# Patient Record
Sex: Male | Born: 1975
Health system: Southern US, Community
[De-identification: ages and names within clinical notes are randomized; demographics above are authoritative.]

## PROBLEM LIST (undated history)

## (undated) DIAGNOSIS — F99 Mental disorder, not otherwise specified: Secondary | ICD-10-CM

## (undated) DIAGNOSIS — R51 Headache: Secondary | ICD-10-CM

## (undated) DIAGNOSIS — W3400XA Accidental discharge from unspecified firearms or gun, initial encounter: Secondary | ICD-10-CM

## (undated) DIAGNOSIS — R569 Unspecified convulsions: Secondary | ICD-10-CM

## (undated) HISTORY — PX: FRACTURE SURGERY: SHX138

## (undated) HISTORY — PX: KNEE SURGERY: SHX244

---
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
## 2001-10-04 ENCOUNTER — Encounter: Payer: Self-pay | Admitting: *Deleted

## 2001-10-04 ENCOUNTER — Inpatient Hospital Stay (HOSPITAL_COMMUNITY): Admission: EM | Admit: 2001-10-04 | Discharge: 2001-10-05 | Payer: Self-pay

## 2001-10-14 ENCOUNTER — Inpatient Hospital Stay (HOSPITAL_COMMUNITY): Admission: RE | Admit: 2001-10-14 | Discharge: 2001-10-18 | Payer: Self-pay | Admitting: *Deleted

## 2002-07-09 ENCOUNTER — Emergency Department (HOSPITAL_COMMUNITY): Admission: EM | Admit: 2002-07-09 | Discharge: 2002-07-09 | Payer: Self-pay | Admitting: Emergency Medicine

## 2002-07-09 ENCOUNTER — Encounter: Payer: Self-pay | Admitting: Emergency Medicine

## 2002-08-20 ENCOUNTER — Emergency Department (HOSPITAL_COMMUNITY): Admission: EM | Admit: 2002-08-20 | Discharge: 2002-08-20 | Payer: Self-pay | Admitting: Emergency Medicine

## 2002-10-01 ENCOUNTER — Emergency Department (HOSPITAL_COMMUNITY): Admission: EM | Admit: 2002-10-01 | Discharge: 2002-10-01 | Payer: Self-pay | Admitting: Emergency Medicine

## 2002-10-09 ENCOUNTER — Emergency Department (HOSPITAL_COMMUNITY): Admission: EM | Admit: 2002-10-09 | Discharge: 2002-10-09 | Payer: Self-pay | Admitting: Emergency Medicine

## 2013-03-24 ENCOUNTER — Inpatient Hospital Stay (HOSPITAL_COMMUNITY)
Admission: EM | Admit: 2013-03-24 | Discharge: 2013-03-27 | DRG: 494 | Disposition: A | Discharge status: Home / Self Care | Payer: No Typology Code available for payment source | Attending: Orthopedic Surgery | Admitting: Orthopedic Surgery

## 2013-03-24 ENCOUNTER — Encounter (HOSPITAL_COMMUNITY)
Admission: EM | Disposition: A | Discharge status: Home / Self Care | Payer: Self-pay | Source: Home / Self Care | Attending: Orthopedic Surgery

## 2013-03-24 ENCOUNTER — Inpatient Hospital Stay (HOSPITAL_COMMUNITY): Payer: No Typology Code available for payment source

## 2013-03-24 ENCOUNTER — Emergency Department (HOSPITAL_COMMUNITY): Payer: No Typology Code available for payment source

## 2013-03-24 ENCOUNTER — Encounter (HOSPITAL_COMMUNITY): Payer: Self-pay | Admitting: Anesthesiology

## 2013-03-24 ENCOUNTER — Emergency Department (HOSPITAL_COMMUNITY): Payer: No Typology Code available for payment source | Admitting: Anesthesiology

## 2013-03-24 ENCOUNTER — Encounter (HOSPITAL_COMMUNITY): Payer: Self-pay

## 2013-03-24 DIAGNOSIS — T148XXA Other injury of unspecified body region, initial encounter: Secondary | ICD-10-CM | POA: Diagnosis present

## 2013-03-24 DIAGNOSIS — S71009A Unspecified open wound, unspecified hip, initial encounter: Secondary | ICD-10-CM | POA: Diagnosis present

## 2013-03-24 DIAGNOSIS — Z4789 Encounter for other orthopedic aftercare: Secondary | ICD-10-CM

## 2013-03-24 DIAGNOSIS — S71109A Unspecified open wound, unspecified thigh, initial encounter: Secondary | ICD-10-CM | POA: Diagnosis present

## 2013-03-24 DIAGNOSIS — Y929 Unspecified place or not applicable: Secondary | ICD-10-CM

## 2013-03-24 DIAGNOSIS — W3400XA Accidental discharge from unspecified firearms or gun, initial encounter: Secondary | ICD-10-CM

## 2013-03-24 DIAGNOSIS — S82899A Other fracture of unspecified lower leg, initial encounter for closed fracture: Principal | ICD-10-CM | POA: Diagnosis present

## 2013-03-24 HISTORY — PX: I&D EXTREMITY: SHX5045

## 2013-03-24 HISTORY — PX: EXTERNAL FIXATION LEG: SHX1549

## 2013-03-24 HISTORY — DX: Accidental discharge from unspecified firearms or gun, initial encounter: W34.00XA

## 2013-03-24 LAB — CBC WITH DIFFERENTIAL/PLATELET
Basophils Absolute: 0.1 10*3/uL (ref 0.0–0.1)
Basophils Relative: 1 % (ref 0–1)
Eosinophils Absolute: 0.2 10*3/uL (ref 0.0–0.7)
Eosinophils Relative: 1 % (ref 0–5)
HCT: 48.4 % (ref 39.0–52.0)
Lymphocytes Relative: 30 % (ref 12–46)
MCHC: 35.3 g/dL (ref 30.0–36.0)
MCV: 92.5 fL (ref 78.0–100.0)
Monocytes Absolute: 1 10*3/uL (ref 0.1–1.0)
Platelets: 351 10*3/uL (ref 150–400)
RDW: 13.2 % (ref 11.5–15.5)
WBC: 16.1 10*3/uL — ABNORMAL HIGH (ref 4.0–10.5)

## 2013-03-24 LAB — POCT I-STAT, CHEM 8
BUN: 5 mg/dL — ABNORMAL LOW (ref 6–23)
Calcium, Ion: 1.07 mmol/L — ABNORMAL LOW (ref 1.12–1.23)
Chloride: 108 mEq/L (ref 96–112)
Creatinine, Ser: 1.3 mg/dL (ref 0.50–1.35)
TCO2: 19 mmol/L (ref 0–100)

## 2013-03-24 LAB — TYPE AND SCREEN
ABO/RH(D): O POS
Antibody Screen: NEGATIVE

## 2013-03-24 LAB — BASIC METABOLIC PANEL
Calcium: 9.3 mg/dL (ref 8.4–10.5)
Creatinine, Ser: 1.11 mg/dL (ref 0.50–1.35)
GFR calc Af Amer: >90 mL/min (ref >90)
GFR calc non Af Amer: 83 mL/min — ABNORMAL LOW (ref >90)
Sodium: 145 mEq/L (ref 135–145)

## 2013-03-24 LAB — PROTIME-INR: INR: 0.94 (ref 0.00–1.49)

## 2013-03-24 LAB — CG4 I-STAT (LACTIC ACID): Lactic Acid, Venous: 8.55 mmol/L — ABNORMAL HIGH (ref 0.5–2.2)

## 2013-03-24 SURGERY — IRRIGATION AND DEBRIDEMENT EXTREMITY
Anesthesia: General | Site: Thigh | Laterality: Left | Wound class: Dirty or Infected

## 2013-03-24 MED ORDER — SODIUM CHLORIDE 0.9 % IV BOLUS (SEPSIS)
1000.0000 mL | Freq: Once | INTRAVENOUS | Status: AC
  Administered 2013-03-24: 1000 mL via INTRAVENOUS

## 2013-03-24 MED ORDER — SODIUM CHLORIDE 0.9 % IR SOLN
Status: DC | PRN
  Administered 2013-03-24 (×2): 1000 mL

## 2013-03-24 MED ORDER — HYDROMORPHONE HCL PF 1 MG/ML IJ SOLN
INTRAMUSCULAR | Status: AC
  Filled 2013-03-24: qty 2

## 2013-03-24 MED ORDER — LIDOCAINE HCL (CARDIAC) 20 MG/ML IV SOLN
INTRAVENOUS | Status: DC | PRN
  Administered 2013-03-24: 60 mg via INTRAVENOUS

## 2013-03-24 MED ORDER — OXYCODONE HCL 5 MG PO TABS
5.0000 mg | ORAL_TABLET | ORAL | Status: DC | PRN
  Administered 2013-03-24 (×2): 10 mg via ORAL
  Filled 2013-03-24 (×2): qty 2

## 2013-03-24 MED ORDER — HYDROMORPHONE HCL PF 1 MG/ML IJ SOLN
0.2500 mg | INTRAMUSCULAR | Status: DC | PRN
  Administered 2013-03-24 (×4): 0.5 mg via INTRAVENOUS

## 2013-03-24 MED ORDER — 0.9 % SODIUM CHLORIDE (POUR BTL) OPTIME
TOPICAL | Status: DC | PRN
  Administered 2013-03-24: 1000 mL

## 2013-03-24 MED ORDER — TETANUS-DIPHTH-ACELL PERTUSSIS 5-2.5-18.5 LF-MCG/0.5 IM SUSP
0.5000 mL | Freq: Once | INTRAMUSCULAR | Status: AC
  Administered 2013-03-24: 0.5 mL via INTRAMUSCULAR
  Filled 2013-03-24: qty 0.5

## 2013-03-24 MED ORDER — FENTANYL CITRATE 0.05 MG/ML IJ SOLN
INTRAMUSCULAR | Status: DC | PRN
  Administered 2013-03-24 (×2): 100 ug via INTRAVENOUS

## 2013-03-24 MED ORDER — LACTATED RINGERS IV SOLN
INTRAVENOUS | Status: DC | PRN
  Administered 2013-03-24 (×3): via INTRAVENOUS

## 2013-03-24 MED ORDER — CEFAZOLIN SODIUM-DEXTROSE 2-3 GM-% IV SOLR
2.0000 g | Freq: Once | INTRAVENOUS | Status: AC
Start: 2013-03-24 — End: 2013-03-24
  Administered 2013-03-24: 2 g via INTRAVENOUS
  Filled 2013-03-24: qty 50

## 2013-03-24 MED ORDER — SUCCINYLCHOLINE CHLORIDE 20 MG/ML IJ SOLN
INTRAMUSCULAR | Status: DC | PRN
  Administered 2013-03-24: 100 mg via INTRAVENOUS

## 2013-03-24 MED ORDER — HYDROMORPHONE HCL PF 1 MG/ML IJ SOLN
1.0000 mg | Freq: Once | INTRAMUSCULAR | Status: AC
  Administered 2013-03-24: 1 mg via INTRAVENOUS
  Filled 2013-03-24: qty 1

## 2013-03-24 MED ORDER — ONDANSETRON HCL 4 MG/2ML IJ SOLN: 4.0000 mg | Freq: Once | INTRAMUSCULAR | Status: DC | PRN

## 2013-03-24 MED ORDER — METOCLOPRAMIDE HCL 5 MG/ML IJ SOLN: 5.0000 mg | Freq: Three times a day (TID) | INTRAMUSCULAR | Status: DC | PRN

## 2013-03-24 MED ORDER — ONDANSETRON HCL 4 MG/2ML IJ SOLN: 4.0000 mg | Freq: Four times a day (QID) | INTRAMUSCULAR | Status: DC | PRN

## 2013-03-24 MED ORDER — RIVAROXABAN 10 MG PO TABS
10.0000 mg | ORAL_TABLET | Freq: Every day | ORAL | Status: DC
  Administered 2013-03-24 – 2013-03-25 (×2): 10 mg via ORAL
  Filled 2013-03-24 (×3): qty 1

## 2013-03-24 MED ORDER — PROPOFOL 10 MG/ML IV BOLUS
INTRAVENOUS | Status: DC | PRN
  Administered 2013-03-24: 200 mg via INTRAVENOUS

## 2013-03-24 MED ORDER — METHOCARBAMOL 100 MG/ML IJ SOLN
500.0000 mg | Freq: Four times a day (QID) | INTRAVENOUS | Status: DC | PRN
  Filled 2013-03-24: qty 5

## 2013-03-24 MED ORDER — METOCLOPRAMIDE HCL 10 MG PO TABS: 5.0000 mg | ORAL_TABLET | Freq: Three times a day (TID) | ORAL | Status: DC | PRN

## 2013-03-24 MED ORDER — CEFAZOLIN SODIUM-DEXTROSE 2-3 GM-% IV SOLR
2.0000 g | Freq: Three times a day (TID) | INTRAVENOUS | Status: AC
  Administered 2013-03-24 – 2013-03-25 (×3): 2 g via INTRAVENOUS
  Filled 2013-03-24 (×3): qty 50

## 2013-03-24 MED ORDER — OXYCODONE HCL 5 MG PO TABS
5.0000 mg | ORAL_TABLET | ORAL | Status: DC | PRN
  Administered 2013-03-24 (×2): 20 mg via ORAL
  Administered 2013-03-25: 15 mg via ORAL
  Administered 2013-03-25: 20 mg via ORAL
  Administered 2013-03-25: 15 mg via ORAL
  Administered 2013-03-25 – 2013-03-27 (×11): 20 mg via ORAL
  Administered 2013-03-27: 5 mg via ORAL
  Administered 2013-03-27 (×3): 20 mg via ORAL
  Filled 2013-03-24 (×4): qty 4
  Filled 2013-03-24: qty 3
  Filled 2013-03-24: qty 4
  Filled 2013-03-24: qty 3
  Filled 2013-03-24 (×13): qty 4

## 2013-03-24 MED ORDER — POTASSIUM CHLORIDE IN NACL 20-0.9 MEQ/L-% IV SOLN
INTRAVENOUS | Status: AC
  Administered 2013-03-24 – 2013-03-25 (×2): via INTRAVENOUS
  Filled 2013-03-24 (×3): qty 1000

## 2013-03-24 MED ORDER — CEFAZOLIN SODIUM-DEXTROSE 2-3 GM-% IV SOLR
INTRAVENOUS | Status: AC
  Administered 2013-03-24: 2 g via INTRAVENOUS
  Filled 2013-03-24: qty 50

## 2013-03-24 MED ORDER — METHOCARBAMOL 500 MG PO TABS
500.0000 mg | ORAL_TABLET | Freq: Four times a day (QID) | ORAL | Status: DC | PRN
  Administered 2013-03-24 – 2013-03-27 (×10): 500 mg via ORAL
  Filled 2013-03-24 (×10): qty 1

## 2013-03-24 MED ORDER — ACETAMINOPHEN 10 MG/ML IV SOLN: 1000.0000 mg | Freq: Once | INTRAVENOUS | Status: DC | PRN

## 2013-03-24 MED ORDER — MORPHINE SULFATE 2 MG/ML IJ SOLN
1.0000 mg | INTRAMUSCULAR | Status: DC | PRN
  Administered 2013-03-24 (×2): 1 mg via INTRAVENOUS
  Filled 2013-03-24 (×2): qty 1

## 2013-03-24 MED ORDER — ONDANSETRON HCL 4 MG/2ML IJ SOLN
4.0000 mg | Freq: Once | INTRAMUSCULAR | Status: AC
  Administered 2013-03-24: 4 mg via INTRAVENOUS
  Filled 2013-03-24 (×2): qty 2

## 2013-03-24 MED ORDER — ONDANSETRON HCL 4 MG PO TABS: 4.0000 mg | ORAL_TABLET | Freq: Four times a day (QID) | ORAL | Status: DC | PRN

## 2013-03-24 MED ORDER — MORPHINE SULFATE 2 MG/ML IJ SOLN
2.0000 mg | INTRAMUSCULAR | Status: DC | PRN
  Administered 2013-03-24 – 2013-03-26 (×8): 2 mg via INTRAVENOUS
  Filled 2013-03-24 (×8): qty 1

## 2013-03-24 MED ORDER — FENTANYL CITRATE 0.05 MG/ML IJ SOLN
100.0000 ug | Freq: Once | INTRAMUSCULAR | Status: AC
  Administered 2013-03-24: 100 ug via INTRAVENOUS
  Filled 2013-03-24 (×2): qty 2

## 2013-03-24 SURGICAL SUPPLY — 85 items
BANDAGE ELASTIC 4 VELCRO ST LF (GAUZE/BANDAGES/DRESSINGS) ×3 IMPLANT
BANDAGE ELASTIC 6 VELCRO ST LF (GAUZE/BANDAGES/DRESSINGS) ×6 IMPLANT
BANDAGE ESMARK 6X9 LF (GAUZE/BANDAGES/DRESSINGS) ×2 IMPLANT
BANDAGE GAUZE ELAST BULKY 4 IN (GAUZE/BANDAGES/DRESSINGS) ×6 IMPLANT
BAR ×6 IMPLANT
BAR EXFIX SM BONE 10.5X350 (Trauma) ×3 IMPLANT
BIT DRILL 3.5 SHOFT HALF PIN (BIT) ×3 IMPLANT
BLADE SURG 15 STRL LF DISP TIS (BLADE) ×2 IMPLANT
BLADE SURG 15 STRL SS (BLADE) ×1
BNDG COHESIVE 4X5 TAN STRL (GAUZE/BANDAGES/DRESSINGS) IMPLANT
BNDG COHESIVE 6X5 TAN STRL LF (GAUZE/BANDAGES/DRESSINGS) ×3 IMPLANT
BNDG ESMARK 6X9 LF (GAUZE/BANDAGES/DRESSINGS) ×3
CLAMP BAR TO BAR (Clamp) ×6 IMPLANT
CLAMP BAR TO RING (Clamp) ×6 IMPLANT
CLEANER TIP ELECTROSURG 2X2 (MISCELLANEOUS) ×3 IMPLANT
CLOTH BEACON ORANGE TIMEOUT ST (SAFETY) ×3 IMPLANT
COVER SURGICAL LIGHT HANDLE (MISCELLANEOUS) ×3 IMPLANT
CUFF TOURNIQUET SINGLE 18IN (TOURNIQUET CUFF) ×3 IMPLANT
CUFF TOURNIQUET SINGLE 24IN (TOURNIQUET CUFF) IMPLANT
CUFF TOURNIQUET SINGLE 34IN LL (TOURNIQUET CUFF) IMPLANT
CUFF TOURNIQUET SINGLE 44IN (TOURNIQUET CUFF) IMPLANT
DRAPE C-ARM 42X72 X-RAY (DRAPES) ×3 IMPLANT
DRAPE OEC MINIVIEW 54X84 (DRAPES) ×3 IMPLANT
DRAPE U-SHAPE 47X51 STRL (DRAPES) ×3 IMPLANT
DRSG ADAPTIC 3X8 NADH LF (GAUZE/BANDAGES/DRESSINGS) ×3 IMPLANT
DRSG PAD ABDOMINAL 8X10 ST (GAUZE/BANDAGES/DRESSINGS) ×9 IMPLANT
DURAPREP 26ML APPLICATOR (WOUND CARE) ×3 IMPLANT
ELECT REM PT RETURN 9FT ADLT (ELECTROSURGICAL) ×3
ELECTRODE REM PT RTRN 9FT ADLT (ELECTROSURGICAL) ×2 IMPLANT
EVACUATOR 1/8 PVC DRAIN (DRAIN) IMPLANT
FACESHIELD LNG OPTICON STERILE (SAFETY) ×3 IMPLANT
FREEDOM POST CLAMP ×6 IMPLANT
GAUZE XEROFORM 5X9 LF (GAUZE/BANDAGES/DRESSINGS) ×3 IMPLANT
GLOVE BIOGEL PI IND STRL 8 (GLOVE) ×2 IMPLANT
GLOVE BIOGEL PI INDICATOR 8 (GLOVE) ×1
GLOVE BIOGEL PI ORTHO PRO 7.5 (GLOVE) ×1
GLOVE PI ORTHO PRO STRL 7.5 (GLOVE) ×2 IMPLANT
GLOVE SURG ORTHO 8.0 STRL STRW (GLOVE) ×3 IMPLANT
GLOVE SURG SS PI 7.5 STRL IVOR (GLOVE) ×3 IMPLANT
GOWN PREVENTION PLUS LG XLONG (DISPOSABLE) IMPLANT
GOWN PREVENTION PLUS XLARGE (GOWN DISPOSABLE) ×3 IMPLANT
GOWN STRL NON-REIN LRG LVL3 (GOWN DISPOSABLE) ×9 IMPLANT
GOWN STRL REIN XL XLG (GOWN DISPOSABLE) ×3 IMPLANT
HANDPIECE INTERPULSE COAX TIP (DISPOSABLE)
KIT BASIN OR (CUSTOM PROCEDURE TRAY) ×3 IMPLANT
KIT ROOM TURNOVER OR (KITS) ×3 IMPLANT
MANIFOLD NEPTUNE II (INSTRUMENTS) ×3 IMPLANT
NEEDLE 22X1 1/2 (OR ONLY) (NEEDLE) IMPLANT
NS IRRIG 1000ML POUR BTL (IV SOLUTION) ×18 IMPLANT
PACK ORTHO EXTREMITY (CUSTOM PROCEDURE TRAY) ×3 IMPLANT
PAD ARMBOARD 7.5X6 YLW CONV (MISCELLANEOUS) ×6 IMPLANT
PAD CAST 4YDX4 CTTN HI CHSV (CAST SUPPLIES) IMPLANT
PADDING CAST COTTON 4X4 STRL (CAST SUPPLIES)
PADDING CAST COTTON 6X4 STRL (CAST SUPPLIES) ×3 IMPLANT
PIN CLAMP ×3 IMPLANT
PIN HALF 5X40 (EXFIX) ×6 IMPLANT
PIN TRACTION 5X50 (EXFIX) ×3 IMPLANT
SET HNDPC FAN SPRY TIP SCT (DISPOSABLE) IMPLANT
SPONGE GAUZE 4X4 12PLY (GAUZE/BANDAGES/DRESSINGS) ×9 IMPLANT
SPONGE LAP 18X18 X RAY DECT (DISPOSABLE) ×12 IMPLANT
SPONGE LAP 4X18 X RAY DECT (DISPOSABLE) ×3 IMPLANT
SPONGE SCRUB IODOPHOR (GAUZE/BANDAGES/DRESSINGS) ×3 IMPLANT
STAPLER VISISTAT 35W (STAPLE) IMPLANT
STOCKINETTE IMPERVIOUS 9X36 MD (GAUZE/BANDAGES/DRESSINGS) ×3 IMPLANT
STOCKINETTE IMPERVIOUS LG (DRAPES) ×6 IMPLANT
STRIP CLOSURE SKIN 1/2X4 (GAUZE/BANDAGES/DRESSINGS) IMPLANT
SUCTION FRAZIER TIP 10 FR DISP (SUCTIONS) IMPLANT
SUT ETHILON 2 0 FS 18 (SUTURE) IMPLANT
SUT ETHILON 3 0 PS 1 (SUTURE) ×6 IMPLANT
SUT ETHILON 4 0 PS 2 18 (SUTURE) IMPLANT
SUT PROLENE 3 0 PS 2 (SUTURE) IMPLANT
SUT VIC AB 0 CT1 27 (SUTURE) ×2
SUT VIC AB 0 CT1 27XBRD ANBCTR (SUTURE) ×4 IMPLANT
SUT VIC AB 2-0 CT1 27 (SUTURE) ×2
SUT VIC AB 2-0 CT1 TAPERPNT 27 (SUTURE) ×4 IMPLANT
SUT VIC AB 3-0 SH 27 (SUTURE)
SUT VIC AB 3-0 SH 27X BRD (SUTURE) IMPLANT
SYR CONTROL 10ML LL (SYRINGE) IMPLANT
TOWEL OR 17X24 6PK STRL BLUE (TOWEL DISPOSABLE) ×9 IMPLANT
TOWEL OR 17X26 10 PK STRL BLUE (TOWEL DISPOSABLE) ×3 IMPLANT
TUBE ANAEROBIC SPECIMEN COL (MISCELLANEOUS) IMPLANT
TUBE CONNECTING 12X1/4 (SUCTIONS) ×6 IMPLANT
UNDERPAD 30X30 INCONTINENT (UNDERPADS AND DIAPERS) ×3 IMPLANT
WATER STERILE IRR 1000ML POUR (IV SOLUTION) ×6 IMPLANT
YANKAUER SUCT BULB TIP NO VENT (SUCTIONS) ×9 IMPLANT
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
## 2013-03-24 NOTE — ED Notes (Signed)
3 pair gold colored earrings and 1 silver  tone watch given to girlfriend.   Clothing, shoes and telephone given to girlfriend.
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
## 2013-03-24 NOTE — Anesthesia Procedure Notes (Signed)
Procedure Name: Intubation Date/Time: 03/24/2013 6:10 AM Performed by: Molli Hazard Pre-anesthesia Checklist: Patient identified, Emergency Drugs available, Suction available and Patient being monitored Patient Re-evaluated:Patient Re-evaluated prior to inductionOxygen Delivery Method: Circle system utilized Preoxygenation: Pre-oxygenation with 100% oxygen Intubation Type: IV induction, Rapid sequence and Cricoid Pressure applied Laryngoscope Size: Miller and 3 Grade View: Grade II Tube type: Oral Tube size: 7.5 mm Number of attempts: 2 (Miller 2, unable to visualize. Miller 3 = Gr 2 view) Airway Equipment and Method: Stylet Placement Confirmation: ETT inserted through vocal cords under direct vision,  positive ETCO2 and breath sounds checked- equal and bilateral Secured at: 22 cm Tube secured with: Tape Dental Injury: Teeth and Oropharynx as per pre-operative assessment

## 2013-03-24 NOTE — Anesthesia Postprocedure Evaluation (Signed)
Anesthesia Post Note  Patient: Daniel Bender  Procedure(s) Performed: Procedure(s) (LRB): IRRIGATION AND DEBRIDEMENT EXTREMITY (Bilateral) EXTERNAL FIXATION LEG (Left)  Anesthesia type: General  Patient location: PACU  Post pain: Pain level controlled and Adequate analgesia  Post assessment: Post-op Vital signs reviewed, Patient's Cardiovascular Status Stable, Respiratory Function Stable, Patent Airway and Pain level controlled  Last Vitals:  Filed Vitals:   03/24/13 0815  BP: 141/84  Pulse: 104  Temp:   Resp: 16    Post vital signs: Reviewed and stable  Level of consciousness: awake, alert  and oriented  Complications: No apparent anesthesia complications

## 2013-03-24 NOTE — Transfer of Care (Signed)
Immediate Anesthesia Transfer of Care Note  Patient: Daniel Bender  Procedure(s) Performed: Procedure(s) with comments: IRRIGATION AND DEBRIDEMENT EXTREMITY (Bilateral) - left ankle also being irrigated and debrided EXTERNAL FIXATION LEG (Right)  Patient Location: PACU  Anesthesia Type:General  Level of Consciousness: awake  Airway & Oxygen Therapy: Patient Spontanous Breathing and Patient connected to nasal cannula oxygen  Post-op Assessment: Report given to PACU RN and Post -op Vital signs reviewed and stable  Post vital signs: Reviewed and stable  Complications: No apparent anesthesia complications

## 2013-03-24 NOTE — ED Notes (Signed)
Returned from xray at this time.
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
## 2013-03-24 NOTE — ED Notes (Signed)
Patient to xray via stretcher at this time.

## 2013-03-24 NOTE — ED Notes (Signed)
Patient transported to CT 
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
## 2013-03-24 NOTE — ED Notes (Addendum)
Patients belongings given to [employer] [facility]  at this time. Only took patients shorts at this time. Other belongings labeled in patient belonging bag in room.
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
## 2013-03-25 MED ORDER — DIPHENHYDRAMINE HCL 25 MG PO CAPS
25.0000 mg | ORAL_CAPSULE | Freq: Four times a day (QID) | ORAL | Status: DC | PRN
  Administered 2013-03-25 – 2013-03-27 (×8): 25 mg via ORAL
  Filled 2013-03-25 (×8): qty 1

## 2013-03-25 NOTE — Progress Notes (Signed)
Subjective: Pt stable - pain controlled   Objective: Vital signs in last 24 hours: Temp:  [98 F (36.7 C)-98.8 F (37.1 C)] 98 F (36.7 C) (07/06 0635) Pulse Rate:  [81-100] 81 (07/06 0635) Resp:  [14-18] 14 (07/06 0635) BP: (117-136)/(67-73) 130/68 mmHg (07/06 0635) SpO2:  [97 %-100 %] 100 % (07/06 0635)  Intake/Output from previous day: 07/05 0701 - 07/06 0700 In: 2108.8 [I.V.:2008.8; IV Piggyback:100] Out: 665 [Urine:650; Blood:15] Intake/Output this shift:    Exam:  Dorsiflexion/Plantar flexion intact No cellulitis present Compartment soft  Labs:  Recent Labs  03/24/13 0153 03/24/13 0159  HGB 17.1* 17.7*    Recent Labs  03/24/13 0153 03/24/13 0159  WBC 16.1*  --   RBC 5.23  --   HCT 48.4 52.0  PLT 351  --     Recent Labs  03/24/13 0153 03/24/13 0159  NA 145 148*  K 3.2* 3.3*  CL 105 108  CO2 18*  --   BUN 7 5*  CREATININE 1.11 1.30  GLUCOSE 91 92  CALCIUM 9.3  --     Recent Labs  03/24/13 0153  INR 0.94    Assessment/Plan: Plan to check incisions mon and plan for continued ex fix vs im rod Tuesday - on xarelto   DEAN,GREGORY SCOTT 03/25/2013, 9:59 AM
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
## 2013-03-26 MED ORDER — MUPIROCIN 2 % EX OINT
TOPICAL_OINTMENT | Freq: Every day | CUTANEOUS | Status: DC
  Filled 2013-03-26: qty 22

## 2013-03-26 MED ORDER — MUPIROCIN CALCIUM 2 % EX CREA
1.0000 "application " | TOPICAL_CREAM | Freq: Every day | CUTANEOUS | Status: DC
  Administered 2013-03-26 – 2013-03-27 (×2): 1 via TOPICAL
  Filled 2013-03-26 (×2): qty 15

## 2013-03-26 MED ORDER — ENOXAPARIN SODIUM 40 MG/0.4ML ~~LOC~~ SOLN
40.0000 mg | SUBCUTANEOUS | Status: DC
  Administered 2013-03-26 – 2013-03-27 (×2): 40 mg via SUBCUTANEOUS
  Filled 2013-03-26 (×2): qty 0.4

## 2013-03-26 NOTE — Progress Notes (Signed)
UR COMPLETED  

## 2013-03-26 NOTE — Progress Notes (Signed)
Subjective: Pt stable - req iv pain meds   Objective: Vital signs in last 24 hours: Temp:  [98.3 F (36.8 C)-99.2 F (37.3 C)] 98.3 F (36.8 C) (07/07 1308) Pulse Rate:  [85-94] 86 (07/07 0633) Resp:  [16-18] 16 (07/07 0633) BP: (131-151)/(64-84) 134/78 mmHg (07/07 0633) SpO2:  [100 %] 100 % (07/07 6578)  Intake/Output from previous day: 07/06 0701 - 07/07 0700 In: 480 [P.O.:480] Out: 800 [Urine:800] Intake/Output this shift:    Exam:  Sensation intact distally Dorsiflexion/Plantar flexion intact No cellulitis present Compartment soft  Labs:  Recent Labs  03/24/13 0153 03/24/13 0159  HGB 17.1* 17.7*    Recent Labs  03/24/13 0153 03/24/13 0159  WBC 16.1*  --   RBC 5.23  --   HCT 48.4 52.0  PLT 351  --     Recent Labs  03/24/13 0153 03/24/13 0159  NA 145 148*  K 3.2* 3.3*  CL 105 108  CO2 18*  --   BUN 7 5*  CREATININE 1.11 1.30  GLUCOSE 91 92  CALCIUM 9.3  --     Recent Labs  03/24/13 0153  INR 0.94    Assessment/Plan: Soft tissue swelling present - will either nail tibia or dc am for later rx - cont xarelto - start pin care   DEAN,GREGORY SCOTT 03/26/2013, 7:19 AM
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
## 2013-03-26 NOTE — OR Nursing (Signed)
corrected charges
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
## 2013-03-27 DIAGNOSIS — M79609 Pain in unspecified limb: Secondary | ICD-10-CM

## 2013-03-27 MED ORDER — OXYCODONE-ACETAMINOPHEN 5-325 MG PO TABS: 1.0000 | ORAL_TABLET | Freq: Four times a day (QID) | ORAL | Status: DC | PRN

## 2013-03-27 MED ORDER — ENOXAPARIN SODIUM 40 MG/0.4ML ~~LOC~~ SOLN: 40.0000 mg | SUBCUTANEOUS | Status: DC

## 2013-03-27 MED ORDER — METHOCARBAMOL 500 MG PO TABS: 500.0000 mg | ORAL_TABLET | Freq: Four times a day (QID) | ORAL | Status: DC | PRN
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
## 2013-03-27 NOTE — Progress Notes (Signed)
OT Cancellation Note  Patient Details Name: Daniel Bender MRN: [id number] DOB: 1975-09-27   Cancelled Treatment:    Reason Eval/Treat Not Completed: Pain limiting ability to participate. Pt refusing stating he did not feel well and that he was planning to d/c today. Stated he will be back next week for surgery. OT educated to have someone with him when getting in shower (also correct technique) and during ADLs.   Earlie Raveling OTR/L [phone] 03/27/2013, 2:10 PM

## 2013-03-27 NOTE — Progress Notes (Signed)
Subjective: Pt stable - c/o pain left leg   Objective: Vital signs in last 24 hours: Temp:  [98.4 F (36.9 C)-98.8 F (37.1 C)] 98.4 F (36.9 C) (07/08 0656) Pulse Rate:  [74-110] 74 (07/08 0656) Resp:  [16-18] 18 (07/08 0656) BP: (118-144)/(53-84) 118/69 mmHg (07/08 0656) SpO2:  [99 %-100 %] 100 % (07/08 0656)  Intake/Output from previous day: 07/07 0701 - 07/08 0700 In: -  Out: 600 [Urine:600] Intake/Output this shift:    Exam:  Sensation intact distally Dorsiflexion/Plantar flexion intact No cellulitis present Compartment soft  Labs: No results found for this basename: HGB,  in the last 72 hours No results found for this basename: WBC, RBC, HCT, PLT,  in the last 72 hours No results found for this basename: NA, K, CL, CO2, BUN, CREATININE, GLUCOSE, CALCIUM,  in the last 72 hours No results found for this basename: LABPT, INR,  in the last 72 hours  Assessment/Plan: Plan dc today after [place] to r/o dvt - will need hhc and instruction in wound care - may also need financial help with lovenox which he needs based on family history - plan or next Tuesday due to swelling   DEAN,GREGORY SCOTT 03/27/2013, 11:11 AM

## 2013-03-27 NOTE — Progress Notes (Signed)
PT Cancellation Note  Patient Details Name: Daniel Bender MRN: [id number] DOB: Jul 23, 1976   Cancelled Treatment:    Reason Eval/Treat Not Completed: Pain limiting ability to participate. Attempted to see pt x2; pt refused due to pain. Pt stated he was planning to D/C today. States "i feel fine and comfortable walking. Ill be back next week for surgery so we will see you then". Will sign off on pt now. All education has been complete. Pt safe from mobility standpoint to D/C home with 24/7 (A). Pt denies need for any home health therapy services.   Donnamarie Poag [place], [place] [phone] 03/27/2013, 1:31 PM

## 2013-03-27 NOTE — Progress Notes (Signed)
Rn reviewed discharge instructions with patient, and girlfriend. All questions answered. Instructed patient about lovenox injections, and pin care, and again about putting no weight on . Reviewed and demonstrated to girlfriend. Patient also set up with [facility] program for prescriptions. Prescriptions given. Pin Care preformed at 1730 prior to patient discharge. Patient then wheeled to girlfriends car at 1800 by NT.

## 2013-03-27 NOTE — Progress Notes (Signed)
VASCULAR LAB PRELIMINARY  PRELIMINARY  PRELIMINARY  PRELIMINARY  Left lower extremity venous duplex completed.    Preliminary report:  Left:  No evidence of DVT, superficial thrombosis, or Baker's cyst.  Vicy Medico, RVT 03/27/2013, 5:12 PM

## 2013-03-29 ENCOUNTER — Other Ambulatory Visit (HOSPITAL_COMMUNITY): Payer: Self-pay | Admitting: Orthopedic Surgery

## 2013-03-29 ENCOUNTER — Encounter (HOSPITAL_COMMUNITY): Payer: Self-pay | Admitting: Orthopedic Surgery

## 2013-04-02 ENCOUNTER — Encounter (HOSPITAL_COMMUNITY): Payer: Self-pay | Admitting: Orthopedic Surgery

## 2013-04-02 MED ORDER — DEXTROSE 5 % IV SOLN
2.0000 g | INTRAVENOUS | Status: AC
  Administered 2013-04-03: 2 g via INTRAVENOUS
  Filled 2013-04-02: qty 20

## 2013-04-02 MED ORDER — DEXTROSE 5 % IV SOLN: 2.0000 g | INTRAVENOUS | Status: DC

## 2013-04-03 ENCOUNTER — Encounter (HOSPITAL_COMMUNITY)
Admission: RE | Disposition: A | Discharge status: Home / Self Care | Payer: Self-pay | Source: Ambulatory Visit | Attending: Orthopedic Surgery

## 2013-04-03 ENCOUNTER — Inpatient Hospital Stay (HOSPITAL_COMMUNITY)
Admission: RE | Admit: 2013-04-03 | Discharge: 2013-04-05 | DRG: 494 | Disposition: A | Discharge status: Home / Self Care | Payer: MEDICAID | Source: Ambulatory Visit | Attending: Orthopedic Surgery | Admitting: Orthopedic Surgery

## 2013-04-03 ENCOUNTER — Inpatient Hospital Stay (HOSPITAL_COMMUNITY): Payer: Self-pay

## 2013-04-03 ENCOUNTER — Encounter (HOSPITAL_COMMUNITY): Payer: Self-pay | Admitting: Anesthesiology

## 2013-04-03 ENCOUNTER — Observation Stay (HOSPITAL_COMMUNITY): Payer: Self-pay

## 2013-04-03 ENCOUNTER — Encounter (HOSPITAL_COMMUNITY): Payer: Self-pay | Admitting: *Deleted

## 2013-04-03 ENCOUNTER — Inpatient Hospital Stay (HOSPITAL_COMMUNITY): Payer: Self-pay | Admitting: Anesthesiology

## 2013-04-03 DIAGNOSIS — S82409A Unspecified fracture of shaft of unspecified fibula, initial encounter for closed fracture: Principal | ICD-10-CM | POA: Diagnosis present

## 2013-04-03 DIAGNOSIS — F17200 Nicotine dependence, unspecified, uncomplicated: Secondary | ICD-10-CM | POA: Diagnosis present

## 2013-04-03 DIAGNOSIS — Z4889 Encounter for other specified surgical aftercare: Secondary | ICD-10-CM

## 2013-04-03 DIAGNOSIS — S82209A Unspecified fracture of shaft of unspecified tibia, initial encounter for closed fracture: Principal | ICD-10-CM | POA: Diagnosis present

## 2013-04-03 DIAGNOSIS — S82202H Unspecified fracture of shaft of left tibia, subsequent encounter for open fracture type I or II with delayed healing: Secondary | ICD-10-CM

## 2013-04-03 DIAGNOSIS — W3400XA Accidental discharge from unspecified firearms or gun, initial encounter: Secondary | ICD-10-CM

## 2013-04-03 HISTORY — PX: EXTERNAL FIXATION REMOVAL: SHX5040

## 2013-04-03 HISTORY — PX: TIBIA IM NAIL INSERTION: SHX2516

## 2013-04-03 HISTORY — DX: Headache: R51

## 2013-04-03 HISTORY — DX: Mental disorder, not otherwise specified: F99

## 2013-04-03 LAB — CBC
HCT: 36.8 % — ABNORMAL LOW (ref 39.0–52.0)
Hemoglobin: 12.7 g/dL — ABNORMAL LOW (ref 13.0–17.0)
MCH: 32.2 pg (ref 26.0–34.0)
MCHC: 34.5 g/dL (ref 30.0–36.0)
MCV: 93.4 fL (ref 78.0–100.0)

## 2013-04-03 LAB — BASIC METABOLIC PANEL
BUN: 8 mg/dL (ref 6–23)
CO2: 28 mEq/L (ref 19–32)
Chloride: 107 mEq/L (ref 96–112)
Glucose, Bld: 94 mg/dL (ref 70–99)
Potassium: 4.3 mEq/L (ref 3.5–5.1)

## 2013-04-03 SURGERY — INSERTION, INTRAMEDULLARY ROD, TIBIA
Anesthesia: General | Site: Leg Lower | Laterality: Left | Wound class: Contaminated

## 2013-04-03 MED ORDER — NEOSTIGMINE METHYLSULFATE 1 MG/ML IJ SOLN
INTRAMUSCULAR | Status: DC | PRN
  Administered 2013-04-03: 5 mg via INTRAVENOUS

## 2013-04-03 MED ORDER — HYDROMORPHONE HCL PF 1 MG/ML IJ SOLN
INTRAMUSCULAR | Status: AC
  Administered 2013-04-03: 0.5 mg via INTRAVENOUS
  Filled 2013-04-03: qty 1

## 2013-04-03 MED ORDER — OXYCODONE HCL 5 MG PO TABS
ORAL_TABLET | ORAL | Status: AC
  Filled 2013-04-03: qty 1

## 2013-04-03 MED ORDER — HYDROMORPHONE HCL PF 1 MG/ML IJ SOLN
0.2500 mg | INTRAMUSCULAR | Status: DC | PRN
  Administered 2013-04-03 (×2): 0.5 mg via INTRAVENOUS

## 2013-04-03 MED ORDER — ONDANSETRON HCL 4 MG/2ML IJ SOLN: 4.0000 mg | Freq: Four times a day (QID) | INTRAMUSCULAR | Status: DC | PRN

## 2013-04-03 MED ORDER — METOCLOPRAMIDE HCL 5 MG/ML IJ SOLN: 5.0000 mg | Freq: Three times a day (TID) | INTRAMUSCULAR | Status: DC | PRN

## 2013-04-03 MED ORDER — ARTIFICIAL TEARS OP OINT
TOPICAL_OINTMENT | OPHTHALMIC | Status: DC | PRN
  Administered 2013-04-03: 1 via OPHTHALMIC

## 2013-04-03 MED ORDER — ONDANSETRON HCL 4 MG/2ML IJ SOLN
INTRAMUSCULAR | Status: DC | PRN
  Administered 2013-04-03 (×2): 4 mg via INTRAVENOUS

## 2013-04-03 MED ORDER — ENOXAPARIN SODIUM 40 MG/0.4ML ~~LOC~~ SOLN
40.0000 mg | SUBCUTANEOUS | Status: DC
  Administered 2013-04-04 – 2013-04-05 (×2): 40 mg via SUBCUTANEOUS
  Filled 2013-04-03 (×3): qty 0.4

## 2013-04-03 MED ORDER — PROPOFOL 10 MG/ML IV BOLUS
INTRAVENOUS | Status: DC | PRN
  Administered 2013-04-03: 100 mg via INTRAVENOUS
  Administered 2013-04-03: 200 mg via INTRAVENOUS
  Administered 2013-04-03: 100 mg via INTRAVENOUS

## 2013-04-03 MED ORDER — METHOCARBAMOL 100 MG/ML IJ SOLN
500.0000 mg | Freq: Four times a day (QID) | INTRAVENOUS | Status: DC | PRN
  Filled 2013-04-03: qty 5

## 2013-04-03 MED ORDER — POTASSIUM CHLORIDE IN NACL 20-0.9 MEQ/L-% IV SOLN
INTRAVENOUS | Status: AC
  Administered 2013-04-04: via INTRAVENOUS
  Filled 2013-04-03 (×3): qty 1000

## 2013-04-03 MED ORDER — MIDAZOLAM HCL 5 MG/5ML IJ SOLN
INTRAMUSCULAR | Status: DC | PRN
  Administered 2013-04-03 (×2): 2 mg via INTRAVENOUS

## 2013-04-03 MED ORDER — ENOXAPARIN SODIUM 40 MG/0.4ML ~~LOC~~ SOLN: 40.0000 mg | SUBCUTANEOUS | Status: DC

## 2013-04-03 MED ORDER — OXYCODONE HCL 5 MG PO TABS
5.0000 mg | ORAL_TABLET | ORAL | Status: DC | PRN
  Administered 2013-04-04 (×3): 10 mg via ORAL
  Filled 2013-04-03 (×3): qty 2

## 2013-04-03 MED ORDER — OXYCODONE HCL 5 MG PO TABS
5.0000 mg | ORAL_TABLET | Freq: Once | ORAL | Status: AC | PRN
  Administered 2013-04-03: 5 mg via ORAL

## 2013-04-03 MED ORDER — 0.9 % SODIUM CHLORIDE (POUR BTL) OPTIME
TOPICAL | Status: DC | PRN
  Administered 2013-04-03: 1000 mL

## 2013-04-03 MED ORDER — METHOCARBAMOL 500 MG PO TABS
500.0000 mg | ORAL_TABLET | Freq: Four times a day (QID) | ORAL | Status: DC | PRN
  Administered 2013-04-04 – 2013-04-05 (×2): 500 mg via ORAL
  Filled 2013-04-03 (×2): qty 1

## 2013-04-03 MED ORDER — VECURONIUM BROMIDE 10 MG IV SOLR
INTRAVENOUS | Status: DC | PRN
  Administered 2013-04-03: 7 mg via INTRAVENOUS
  Administered 2013-04-03: 3 mg via INTRAVENOUS

## 2013-04-03 MED ORDER — HYDROMORPHONE HCL PF 1 MG/ML IJ SOLN
0.5000 mg | INTRAMUSCULAR | Status: DC | PRN
  Administered 2013-04-03 – 2013-04-04 (×4): 1 mg via INTRAVENOUS
  Filled 2013-04-03 (×4): qty 1

## 2013-04-03 MED ORDER — LACTATED RINGERS IV SOLN
INTRAVENOUS | Status: DC
  Administered 2013-04-03: 13:00:00 via INTRAVENOUS

## 2013-04-03 MED ORDER — METHOCARBAMOL 500 MG PO TABS
ORAL_TABLET | ORAL | Status: AC
  Administered 2013-04-03: 500 mg via ORAL
  Filled 2013-04-03: qty 1

## 2013-04-03 MED ORDER — ONDANSETRON HCL 4 MG PO TABS: 4.0000 mg | ORAL_TABLET | Freq: Four times a day (QID) | ORAL | Status: DC | PRN

## 2013-04-03 MED ORDER — METOCLOPRAMIDE HCL 10 MG PO TABS: 5.0000 mg | ORAL_TABLET | Freq: Three times a day (TID) | ORAL | Status: DC | PRN

## 2013-04-03 MED ORDER — OXYCODONE HCL 5 MG/5ML PO SOLN: 5.0000 mg | Freq: Once | ORAL | Status: AC | PRN

## 2013-04-03 MED ORDER — LACTATED RINGERS IV SOLN
INTRAVENOUS | Status: DC | PRN
  Administered 2013-04-03 (×2): via INTRAVENOUS

## 2013-04-03 MED ORDER — FENTANYL CITRATE 0.05 MG/ML IJ SOLN
INTRAMUSCULAR | Status: DC | PRN
Start: 2013-04-03 — End: 2013-04-03
  Administered 2013-04-03: 150 ug via INTRAVENOUS
  Administered 2013-04-03 (×2): 50 ug via INTRAVENOUS
  Administered 2013-04-03: 150 ug via INTRAVENOUS
  Administered 2013-04-03 (×3): 50 ug via INTRAVENOUS

## 2013-04-03 MED ORDER — LIDOCAINE HCL (CARDIAC) 20 MG/ML IV SOLN
INTRAVENOUS | Status: DC | PRN
  Administered 2013-04-03: 75 mg via INTRAVENOUS
  Administered 2013-04-03: 100 mg via INTRAVENOUS

## 2013-04-03 MED ORDER — METHOCARBAMOL 500 MG PO TABS: 500.0000 mg | ORAL_TABLET | Freq: Four times a day (QID) | ORAL | Status: DC | PRN

## 2013-04-03 MED ORDER — PHENOL 1.4 % MT LIQD
1.0000 | OROMUCOSAL | Status: DC | PRN
  Filled 2013-04-03: qty 177

## 2013-04-03 MED ORDER — HYDROMORPHONE HCL PF 1 MG/ML IJ SOLN
0.5000 mg | INTRAMUSCULAR | Status: AC | PRN
  Administered 2013-04-03: 0.5 mg via INTRAVENOUS

## 2013-04-03 MED ORDER — CEFAZOLIN SODIUM-DEXTROSE 2-3 GM-% IV SOLR
2.0000 g | Freq: Three times a day (TID) | INTRAVENOUS | Status: AC
  Administered 2013-04-04 (×2): 2 g via INTRAVENOUS
  Filled 2013-04-03 (×3): qty 50

## 2013-04-03 MED ORDER — DEXTROSE 5 % IV SOLN
INTRAVENOUS | Status: DC | PRN
  Administered 2013-04-03: 16:00:00 via INTRAVENOUS

## 2013-04-03 MED ORDER — GLYCOPYRROLATE 0.2 MG/ML IJ SOLN
INTRAMUSCULAR | Status: DC | PRN
  Administered 2013-04-03: 0.6 mg via INTRAVENOUS

## 2013-04-03 SURGICAL SUPPLY — 70 items
BANDAGE ELASTIC 4 VELCRO ST LF (GAUZE/BANDAGES/DRESSINGS) ×2 IMPLANT
BANDAGE ELASTIC 6 VELCRO ST LF (GAUZE/BANDAGES/DRESSINGS) ×4 IMPLANT
BANDAGE ESMARK 6X9 LF (GAUZE/BANDAGES/DRESSINGS) IMPLANT
BANDAGE GAUZE ELAST BULKY 4 IN (GAUZE/BANDAGES/DRESSINGS) IMPLANT
BIT DRILL LONG 4.0 (BIT) ×1 IMPLANT
BIT DRILL SHORT 4.0 (BIT) ×2 IMPLANT
BLADE SURG 15 STRL LF DISP TIS (BLADE) ×1 IMPLANT
BLADE SURG 15 STRL SS (BLADE) ×1
BLADE SURG ROTATE 9660 (MISCELLANEOUS) ×2 IMPLANT
BNDG COHESIVE 4X5 TAN STRL (GAUZE/BANDAGES/DRESSINGS) IMPLANT
BNDG COHESIVE 6X5 TAN STRL LF (GAUZE/BANDAGES/DRESSINGS) IMPLANT
BNDG ESMARK 6X9 LF (GAUZE/BANDAGES/DRESSINGS)
CAP NAIL 10MM (Cap) ×2 IMPLANT
CLOTH BEACON ORANGE TIMEOUT ST (SAFETY) ×2 IMPLANT
COVER SURGICAL LIGHT HANDLE (MISCELLANEOUS) ×4 IMPLANT
CUFF TOURNIQUET SINGLE 34IN LL (TOURNIQUET CUFF) IMPLANT
CUFF TOURNIQUET SINGLE 44IN (TOURNIQUET CUFF) IMPLANT
DRAPE C-ARM 42X72 X-RAY (DRAPES) ×2 IMPLANT
DRAPE EXTREMITY T 121X128X90 (DRAPE) IMPLANT
DRAPE INCISE IOBAN 66X45 STRL (DRAPES) ×2 IMPLANT
DRAPE ORTHO SPLIT 77X108 STRL (DRAPES) ×2
DRAPE PROXIMA HALF (DRAPES) ×4 IMPLANT
DRAPE SURG ORHT 6 SPLT 77X108 (DRAPES) ×2 IMPLANT
DRAPE U-SHAPE 47X51 STRL (DRAPES) ×2 IMPLANT
DRILL BIT LONG 4.0 (BIT) ×2
DRILL BIT SHORT 4.0 (BIT) ×2
DRSG EMULSION OIL 3X3 NADH (GAUZE/BANDAGES/DRESSINGS) IMPLANT
DRSG PAD ABDOMINAL 8X10 ST (GAUZE/BANDAGES/DRESSINGS) ×14 IMPLANT
DURAPREP 26ML APPLICATOR (WOUND CARE) ×4 IMPLANT
ELECT REM PT RETURN 9FT ADLT (ELECTROSURGICAL) ×2
ELECTRODE REM PT RTRN 9FT ADLT (ELECTROSURGICAL) ×1 IMPLANT
FACESHIELD LNG OPTICON STERILE (SAFETY) ×2 IMPLANT
GAUZE XEROFORM 1X8 LF (GAUZE/BANDAGES/DRESSINGS) IMPLANT
GAUZE XEROFORM 5X9 LF (GAUZE/BANDAGES/DRESSINGS) ×2 IMPLANT
GLOVE BIOGEL PI IND STRL 8 (GLOVE) ×1 IMPLANT
GLOVE BIOGEL PI INDICATOR 8 (GLOVE) ×1
GLOVE SURG ORTHO 8.0 STRL STRW (GLOVE) ×2 IMPLANT
GOWN PREVENTION PLUS LG XLONG (DISPOSABLE) IMPLANT
GOWN PREVENTION PLUS XLARGE (GOWN DISPOSABLE) ×4 IMPLANT
GOWN STRL NON-REIN LRG LVL3 (GOWN DISPOSABLE) IMPLANT
GUIDE PIN 3.2MM (MISCELLANEOUS) ×1
GUIDE PIN ORTH 343X3.2XBRAD (MISCELLANEOUS) ×1 IMPLANT
GUIDE ROD 3.0 (MISCELLANEOUS) ×2
KIT BASIN OR (CUSTOM PROCEDURE TRAY) ×2 IMPLANT
KIT ROOM TURNOVER OR (KITS) ×2 IMPLANT
MANIFOLD NEPTUNE II (INSTRUMENTS) ×2 IMPLANT
NAIL TIBIAL 10X37 (Nail) ×2 IMPLANT
NS IRRIG 1000ML POUR BTL (IV SOLUTION) ×2 IMPLANT
PACK GENERAL/GYN (CUSTOM PROCEDURE TRAY) ×2 IMPLANT
PAD ARMBOARD 7.5X6 YLW CONV (MISCELLANEOUS) ×4 IMPLANT
PAD CAST 4YDX4 CTTN HI CHSV (CAST SUPPLIES) IMPLANT
PADDING CAST COTTON 4X4 STRL (CAST SUPPLIES)
PADDING CAST COTTON 6X4 STRL (CAST SUPPLIES) ×4 IMPLANT
ROD GUIDE 3.0 (MISCELLANEOUS) ×1 IMPLANT
SCREW TRIGEN LOW PROF 5.0X40 (Screw) ×2 IMPLANT
SCREW TRIGEN LOW PROF 5.0X50 (Screw) ×4 IMPLANT
SPONGE GAUZE 4X4 12PLY (GAUZE/BANDAGES/DRESSINGS) ×4 IMPLANT
STAPLER VISISTAT 35W (STAPLE) ×2 IMPLANT
STOCKINETTE IMPERVIOUS 9X36 MD (GAUZE/BANDAGES/DRESSINGS) IMPLANT
STOCKINETTE IMPERVIOUS LG (DRAPES) IMPLANT
SUT ETHILON 3 0 PS 1 (SUTURE) ×4 IMPLANT
SUT ETHILON 4 0 FS 1 (SUTURE) IMPLANT
SUT VIC AB 0 CT1 27 (SUTURE) ×2
SUT VIC AB 0 CT1 27XBRD ANBCTR (SUTURE) ×2 IMPLANT
SUT VIC AB 2-0 CT1 27 (SUTURE) ×2
SUT VIC AB 2-0 CT1 TAPERPNT 27 (SUTURE) ×2 IMPLANT
TOWEL OR 17X24 6PK STRL BLUE (TOWEL DISPOSABLE) ×2 IMPLANT
TOWEL OR 17X26 10 PK STRL BLUE (TOWEL DISPOSABLE) ×2 IMPLANT
TRAY FOLEY CATH 16FRSI W/METER (SET/KITS/TRAYS/PACK) IMPLANT
WATER STERILE IRR 1000ML POUR (IV SOLUTION) IMPLANT
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
## 2013-04-03 NOTE — Anesthesia Preprocedure Evaluation (Signed)
Anesthesia Evaluation  Patient identified by MRN, date of birth, ID band Patient awake    Reviewed: Allergy & Precautions, H&P , NPO status , Patient's Chart, lab work & pertinent test results  Airway Mallampati: II  Neck ROM: full    Dental   Pulmonary Current Smoker,          Cardiovascular     Neuro/Psych  Headaches,    GI/Hepatic   Endo/Other    Renal/GU      Musculoskeletal   Abdominal   Peds  Hematology   Anesthesia Other Findings   Reproductive/Obstetrics                           Anesthesia Physical Anesthesia Plan  ASA: II  Anesthesia Plan: General   Post-op Pain Management:    Induction: Intravenous  Airway Management Planned: Oral ETT  Additional Equipment:   Intra-op Plan:   Post-operative Plan: Extubation in OR  Informed Consent: I have reviewed the patients History and Physical, chart, labs and discussed the procedure including the risks, benefits and alternatives for the proposed anesthesia with the patient or authorized representative who has indicated his/her understanding and acceptance.     Plan Discussed with: CRNA, Anesthesiologist and Surgeon  Anesthesia Plan Comments:         Anesthesia Quick Evaluation

## 2013-04-03 NOTE — Anesthesia Postprocedure Evaluation (Signed)
  Anesthesia Post-op Note  Patient: Daniel Bender  Procedure(s) Performed: Procedure(s): LEFT TIBIA INTRAMEDULLARY (IM) NAIL, POSSIBLE FIBULAR PLATING, REMOVAL EX FIX (Left) REMOVAL EXTERNAL FIXATION LEG (Left)  Patient Location: PACU  Anesthesia Type:General  Level of Consciousness: awake, alert  and oriented  Airway and Oxygen Therapy: Patient Spontanous Breathing and Patient connected to nasal cannula oxygen  Post-op Pain: mild  Post-op Assessment: Post-op Vital signs reviewed, Patient's Cardiovascular Status Stable, Respiratory Function Stable, Patent Airway and Pain level controlled  Post-op Vital Signs: stable  Complications: No apparent anesthesia complications
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
## 2013-04-03 NOTE — Preoperative (Signed)
Beta Blockers   Reason not to administer Beta Blockers:Not Applicable 

## 2013-04-03 NOTE — Transfer of Care (Signed)
Immediate Anesthesia Transfer of Care Note  Patient: Daniel Bender  Procedure(s) Performed: Procedure(s): LEFT TIBIA INTRAMEDULLARY (IM) NAIL, POSSIBLE FIBULAR PLATING, REMOVAL EX FIX (Left) REMOVAL EXTERNAL FIXATION LEG (Left)  Patient Location: PACU  Anesthesia Type:General  Level of Consciousness: sedated, patient cooperative and responds to stimulation  Airway & Oxygen Therapy: Patient Spontanous Breathing and Patient connected to nasal cannula oxygen  Post-op Assessment: Report given to PACU RN, Post -op Vital signs reviewed and stable, Patient moving all extremities and Patient moving all extremities X 4  Post vital signs: Reviewed and stable  Complications: No apparent anesthesia complications

## 2013-04-03 NOTE — Anesthesia Procedure Notes (Signed)
Procedure Name: Intubation Date/Time: 04/03/2013 4:15 PM Performed by: Wray Kearns A Pre-anesthesia Checklist: Patient identified, Timeout performed, Emergency Drugs available, Suction available and Patient being monitored Patient Re-evaluated:Patient Re-evaluated prior to inductionOxygen Delivery Method: Circle system utilized Preoxygenation: Pre-oxygenation with 100% oxygen Intubation Type: IV induction and Cricoid Pressure applied Ventilation: Mask ventilation without difficulty and Oral airway inserted - appropriate to patient size Laryngoscope Size: Mac and 4 Grade View: Grade I Tube type: Oral Tube size: 7.5 mm Number of attempts: 1 Airway Equipment and Method: Stylet Placement Confirmation: ETT inserted through vocal cords under direct vision,  breath sounds checked- equal and bilateral,  positive ETCO2 and CO2 detector Secured at: 23 cm Tube secured with: Tape Dental Injury: Teeth and Oropharynx as per pre-operative assessment

## 2013-04-04 ENCOUNTER — Encounter (HOSPITAL_COMMUNITY): Payer: Self-pay | Admitting: General Practice

## 2013-04-04 MED ORDER — OXYCODONE-ACETAMINOPHEN 5-325 MG PO TABS
1.0000 | ORAL_TABLET | ORAL | Status: DC | PRN
  Administered 2013-04-04 – 2013-04-05 (×3): 2 via ORAL
  Filled 2013-04-04 (×2): qty 2

## 2013-04-04 MED ORDER — OXYCODONE HCL ER 10 MG PO T12A
10.0000 mg | EXTENDED_RELEASE_TABLET | Freq: Two times a day (BID) | ORAL | Status: DC
  Administered 2013-04-04 – 2013-04-05 (×3): 10 mg via ORAL
  Filled 2013-04-04 (×3): qty 1

## 2013-04-04 MED ORDER — KETOROLAC TROMETHAMINE 30 MG/ML IJ SOLN
30.0000 mg | Freq: Once | INTRAMUSCULAR | Status: AC
  Administered 2013-04-05: 30 mg via INTRAVENOUS
  Filled 2013-04-04: qty 1
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
## 2013-04-04 NOTE — Progress Notes (Signed)
Subjective: Pt stable - pain mod well controlled   Objective: Vital signs in last 24 hours: Temp:  [98.3 F (36.8 C)-98.8 F (37.1 C)] 98.3 F (36.8 C) (07/16 0458) Pulse Rate:  [73-100] 81 (07/16 0458) Resp:  [12-20] 16 (07/16 0458) BP: (123-149)/(70-86) 123/72 mmHg (07/16 0458) SpO2:  [95 %-100 %] 100 % (07/16 0458)  Intake/Output from previous day: 07/15 0701 - 07/16 0700 In: 2785 [P.O.:360; I.V.:2425] Out: 1100 [Urine:1000; Blood:100] Intake/Output this shift:    Exam:  Neurovascular intact Sensation intact distally Intact pulses distally  Labs:  Recent Labs  04/03/13 2230  HGB 12.7*    Recent Labs  04/03/13 2230  WBC 22.5*  RBC 3.94*  HCT 36.8*  PLT 439*    Recent Labs  04/03/13 1225 04/03/13 2230  NA 144  --   K 4.3  --   CL 107  --   CO2 28  --   BUN 8  --   CREATININE 1.14 1.16  GLUCOSE 94  --   CALCIUM 9.5  --    No results found for this basename: LABPT, INR,  in the last 72 hours  Assessment/Plan: Plan to mobilize with PT today - start lovenox - iv abx today - dc am after [place] to r/o dvt - will need [facility] program to help pay for 14 d of qd lovenox - cpm for knee today   DEAN,GREGORY SCOTT 04/04/2013, 7:51 AM
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
## 2013-04-05 ENCOUNTER — Encounter (HOSPITAL_COMMUNITY): Payer: Self-pay | Admitting: Orthopedic Surgery

## 2013-04-05 MED ORDER — OXYCODONE-ACETAMINOPHEN 10-325 MG PO TABS: 1.0000 | ORAL_TABLET | Freq: Four times a day (QID) | ORAL | Status: DC | PRN

## 2013-04-05 MED ORDER — ENOXAPARIN SODIUM 40 MG/0.4ML ~~LOC~~ SOLN: 40.0000 mg | SUBCUTANEOUS | Status: DC

## 2013-04-05 MED ORDER — METHOCARBAMOL 500 MG PO TABS: 500.0000 mg | ORAL_TABLET | Freq: Four times a day (QID) | ORAL | Status: DC | PRN

## 2013-04-05 NOTE — Progress Notes (Signed)
Subjective: Pt stable - pain controlled   Objective: Vital signs in last 24 hours: Temp:  [98.4 F (36.9 C)-98.7 F (37.1 C)] 98.7 F (37.1 C) (07/17 0608) Pulse Rate:  [78-92] 78 (07/17 0608) Resp:  [16] 16 (07/17 0608) BP: (122-146)/(76-89) 122/76 mmHg (07/17 0608) SpO2:  [100 %] 100 % (07/17 1610)  Intake/Output from previous day: 07/16 0701 - 07/17 0700 In: 1120 [P.O.:1120] Out: 400 [Urine:400] Intake/Output this shift:    Exam:  Neurovascular intact Sensation intact distally Intact pulses distally  Labs:  Recent Labs  04/03/13 2230  HGB 12.7*    Recent Labs  04/03/13 2230  WBC 22.5*  RBC 3.94*  HCT 36.8*  PLT 439*    Recent Labs  04/03/13 1225 04/03/13 2230  NA 144  --   K 4.3  --   CL 107  --   CO2 28  --   BUN 8  --   CREATININE 1.14 1.16  GLUCOSE 94  --   CALCIUM 9.5  --    No results found for this basename: LABPT, INR,  in the last 72 hours  Assessment/Plan: Dc home - leg soft no [place] needed - will go home with lovenox - nwb - f/u monday   Daniel Bender SCOTT 04/05/2013, 8:12 AM
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
## 2013-10-02 ENCOUNTER — Other Ambulatory Visit (HOSPITAL_COMMUNITY): Payer: Self-pay | Admitting: Orthopedic Surgery

## 2013-10-02 DIAGNOSIS — M25473 Effusion, unspecified ankle: Secondary | ICD-10-CM

## 2013-10-02 DIAGNOSIS — M25579 Pain in unspecified ankle and joints of unspecified foot: Principal | ICD-10-CM

## 2013-10-03 ENCOUNTER — Ambulatory Visit (HOSPITAL_COMMUNITY): Payer: MEDICAID

## 2013-10-10 ENCOUNTER — Encounter (HOSPITAL_COMMUNITY): Payer: Self-pay

## 2013-10-10 ENCOUNTER — Ambulatory Visit (HOSPITAL_COMMUNITY)
Admission: RE | Admit: 2013-10-10 | Discharge: 2013-10-10 | Disposition: A | Discharge status: Home / Self Care | Payer: No Typology Code available for payment source | Source: Ambulatory Visit | Attending: Orthopedic Surgery | Admitting: Orthopedic Surgery

## 2013-10-10 DIAGNOSIS — S8290XS Unspecified fracture of unspecified lower leg, sequela: Secondary | ICD-10-CM | POA: Insufficient documentation

## 2013-10-10 DIAGNOSIS — IMO0002 Reserved for concepts with insufficient information to code with codable children: Secondary | ICD-10-CM | POA: Insufficient documentation

## 2013-10-10 DIAGNOSIS — X58XXXS Exposure to other specified factors, sequela: Secondary | ICD-10-CM | POA: Insufficient documentation

## 2013-10-10 DIAGNOSIS — M25473 Effusion, unspecified ankle: Secondary | ICD-10-CM

## 2013-10-10 DIAGNOSIS — M25579 Pain in unspecified ankle and joints of unspecified foot: Secondary | ICD-10-CM

## 2013-10-17 ENCOUNTER — Encounter (HOSPITAL_COMMUNITY): Payer: Self-pay | Admitting: Pharmacy Technician

## 2013-10-17 ENCOUNTER — Other Ambulatory Visit (HOSPITAL_COMMUNITY): Payer: Self-pay | Admitting: Orthopedic Surgery

## 2013-10-19 ENCOUNTER — Encounter (HOSPITAL_COMMUNITY): Payer: Self-pay

## 2013-10-19 ENCOUNTER — Encounter (HOSPITAL_COMMUNITY)
Admission: RE | Admit: 2013-10-19 | Discharge: 2013-10-19 | Disposition: A | Discharge status: Home / Self Care | Payer: No Typology Code available for payment source | Source: Ambulatory Visit | Attending: Orthopedic Surgery | Admitting: Orthopedic Surgery

## 2013-10-19 ENCOUNTER — Other Ambulatory Visit (HOSPITAL_COMMUNITY): Payer: Self-pay | Admitting: *Deleted

## 2013-10-19 DIAGNOSIS — Z01812 Encounter for preprocedural laboratory examination: Secondary | ICD-10-CM | POA: Insufficient documentation

## 2013-10-19 DIAGNOSIS — Z01818 Encounter for other preprocedural examination: Secondary | ICD-10-CM | POA: Insufficient documentation

## 2013-10-19 LAB — CBC
HCT: 44.4 % (ref 39.0–52.0)
Hemoglobin: 15.9 g/dL (ref 13.0–17.0)
MCH: 33 pg (ref 26.0–34.0)
MCHC: 35.8 g/dL (ref 30.0–36.0)
MCV: 92.1 fL (ref 78.0–100.0)
Platelets: 323 10*3/uL (ref 150–400)
RBC: 4.82 MIL/uL (ref 4.22–5.81)
RDW: 12.8 % (ref 11.5–15.5)
WBC: 12.1 10*3/uL — ABNORMAL HIGH (ref 4.0–10.5)
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
## 2013-10-19 NOTE — Pre-Procedure Instructions (Signed)
Timofey L Montez MoritaCarter  10/19/2013   Your procedure is scheduled on:  Tuesday, October 23, 2013 at 10:10 AM.   [facility]mitting Office at 8:10 AM.   Call this number if you have problems the morning of surgery: [phone]   Remember:   Do not eat food or drink liquids after midnight Monday, 10/22/13.   Take these medicines the morning of surgery with A SIP OF WATER: none     Do not wear jewelry.  Do not wear lotions, powders, or cologne. You may wear deodorant.  Men may shave face and neck.  Do not bring valuables to the hospital.  [facility] is not responsible                  for any belongings or valuables.               Contacts, dentures or bridgework may not be worn into surgery.  Leave suitcase in the car. After surgery it may be brought to your room.  For patients admitted to the hospital, discharge time is determined by your                treatment team.               Special Instructions: Shower using CHG 2 nights before surgery and the night before surgery.  If you shower the day of surgery use CHG.  Use special wash - you have one bottle of CHG for all showers.  You should use approximately 1/3 of the bottle for each shower.   Please read over the following fact sheets that you were given: Pain Booklet, Coughing and Deep Breathing and Surgical Site Infection Prevention

## 2013-10-23 ENCOUNTER — Encounter (HOSPITAL_COMMUNITY)
Admission: RE | Disposition: A | Discharge status: Home / Self Care | Payer: Self-pay | Source: Ambulatory Visit | Attending: Orthopedic Surgery

## 2013-10-23 ENCOUNTER — Ambulatory Visit (HOSPITAL_COMMUNITY): Payer: No Typology Code available for payment source

## 2013-10-23 ENCOUNTER — Ambulatory Visit (HOSPITAL_COMMUNITY): Payer: Self-pay | Admitting: Anesthesiology

## 2013-10-23 ENCOUNTER — Ambulatory Visit (HOSPITAL_COMMUNITY)
Admission: RE | Admit: 2013-10-23 | Discharge: 2013-10-24 | Disposition: A | Discharge status: Home / Self Care | Payer: Self-pay | Source: Ambulatory Visit | Attending: Orthopedic Surgery | Admitting: Orthopedic Surgery

## 2013-10-23 ENCOUNTER — Encounter (HOSPITAL_COMMUNITY): Payer: Self-pay | Admitting: Anesthesiology

## 2013-10-23 ENCOUNTER — Encounter (HOSPITAL_COMMUNITY): Payer: Self-pay | Admitting: *Deleted

## 2013-10-23 DIAGNOSIS — Y831 Surgical operation with implant of artificial internal device as the cause of abnormal reaction of the patient, or of later complication, without mention of misadventure at the time of the procedure: Secondary | ICD-10-CM | POA: Insufficient documentation

## 2013-10-23 DIAGNOSIS — S82209K Unspecified fracture of shaft of unspecified tibia, subsequent encounter for closed fracture with nonunion: Secondary | ICD-10-CM | POA: Diagnosis present

## 2013-10-23 DIAGNOSIS — IMO0002 Reserved for concepts with insufficient information to code with codable children: Secondary | ICD-10-CM | POA: Insufficient documentation

## 2013-10-23 DIAGNOSIS — T84498A Other mechanical complication of other internal orthopedic devices, implants and grafts, initial encounter: Secondary | ICD-10-CM | POA: Insufficient documentation

## 2013-10-23 DIAGNOSIS — Z87891 Personal history of nicotine dependence: Secondary | ICD-10-CM | POA: Insufficient documentation

## 2013-10-23 DIAGNOSIS — Z7982 Long term (current) use of aspirin: Secondary | ICD-10-CM | POA: Insufficient documentation

## 2013-10-23 HISTORY — PX: TIBIA IM NAIL INSERTION: SHX2516

## 2013-10-23 HISTORY — PX: IM NAILING TIBIA: SUR734

## 2013-10-23 LAB — HEPATIC FUNCTION PANEL
ALK PHOS: 112 U/L (ref 39–117)
ALT: 9 U/L (ref 0–53)
AST: 17 U/L (ref 0–37)
Albumin: 3.4 g/dL — ABNORMAL LOW (ref 3.5–5.2)
Bilirubin, Direct: <0.2 mg/dL (ref 0.0–0.3)
Total Bilirubin: 0.5 mg/dL (ref 0.3–1.2)
Total Protein: 6.6 g/dL (ref 6.0–8.3)

## 2013-10-23 LAB — BASIC METABOLIC PANEL
BUN: 6 mg/dL (ref 6–23)
CHLORIDE: 105 meq/L (ref 96–112)
CO2: 29 meq/L (ref 19–32)
Calcium: 9.2 mg/dL (ref 8.4–10.5)
Creatinine, Ser: 1.28 mg/dL (ref 0.50–1.35)
GFR calc Af Amer: 81 mL/min — ABNORMAL LOW (ref >90)
GFR calc non Af Amer: 70 mL/min — ABNORMAL LOW (ref >90)
Glucose, Bld: 93 mg/dL (ref 70–99)
Potassium: 5.1 mEq/L (ref 3.7–5.3)
SODIUM: 143 meq/L (ref 137–147)

## 2013-10-23 SURGERY — INSERTION, INTRAMEDULLARY ROD, TIBIA
Anesthesia: General | Site: Leg Lower | Laterality: Left

## 2013-10-23 MED ORDER — ONDANSETRON HCL 4 MG/2ML IJ SOLN
INTRAMUSCULAR | Status: AC
  Filled 2013-10-23: qty 2

## 2013-10-23 MED ORDER — LACTATED RINGERS IV SOLN
INTRAVENOUS | Status: DC | PRN
  Administered 2013-10-23 (×3): via INTRAVENOUS

## 2013-10-23 MED ORDER — ROCURONIUM BROMIDE 100 MG/10ML IV SOLN
INTRAVENOUS | Status: DC | PRN
  Administered 2013-10-23: 50 mg via INTRAVENOUS

## 2013-10-23 MED ORDER — GLYCOPYRROLATE 0.2 MG/ML IJ SOLN
INTRAMUSCULAR | Status: AC
  Filled 2013-10-23: qty 2

## 2013-10-23 MED ORDER — STERILE WATER FOR INJECTION IJ SOLN
INTRAMUSCULAR | Status: AC
  Filled 2013-10-23: qty 10

## 2013-10-23 MED ORDER — CEFAZOLIN SODIUM-DEXTROSE 2-3 GM-% IV SOLR
2.0000 g | Freq: Four times a day (QID) | INTRAVENOUS | Status: AC
  Administered 2013-10-23 – 2013-10-24 (×3): 2 g via INTRAVENOUS
  Filled 2013-10-23 (×3): qty 50

## 2013-10-23 MED ORDER — DIPHENHYDRAMINE HCL 12.5 MG/5ML PO ELIX
12.5000 mg | ORAL_SOLUTION | ORAL | Status: DC | PRN
  Administered 2013-10-23 (×2): 25 mg via ORAL
  Filled 2013-10-23 (×2): qty 10

## 2013-10-23 MED ORDER — HYDROCODONE-ACETAMINOPHEN 5-325 MG PO TABS: 1.0000 | ORAL_TABLET | ORAL | Status: DC | PRN

## 2013-10-23 MED ORDER — OXYCODONE-ACETAMINOPHEN 5-325 MG PO TABS
1.0000 | ORAL_TABLET | ORAL | Status: DC | PRN
  Administered 2013-10-23 – 2013-10-24 (×3): 2 via ORAL
  Filled 2013-10-23 (×3): qty 2

## 2013-10-23 MED ORDER — VECURONIUM BROMIDE 10 MG IV SOLR
INTRAVENOUS | Status: AC
  Filled 2013-10-23: qty 10

## 2013-10-23 MED ORDER — HYDROMORPHONE HCL PF 1 MG/ML IJ SOLN: 0.2500 mg | INTRAMUSCULAR | Status: DC | PRN

## 2013-10-23 MED ORDER — METHOCARBAMOL 100 MG/ML IJ SOLN
500.0000 mg | INTRAVENOUS | Status: DC
  Filled 2013-10-23: qty 5

## 2013-10-23 MED ORDER — MIDAZOLAM HCL 5 MG/5ML IJ SOLN
INTRAMUSCULAR | Status: DC | PRN
  Administered 2013-10-23: 2 mg via INTRAVENOUS

## 2013-10-23 MED ORDER — ROCURONIUM BROMIDE 50 MG/5ML IV SOLN
INTRAVENOUS | Status: AC
  Filled 2013-10-23: qty 1

## 2013-10-23 MED ORDER — CHLORHEXIDINE GLUCONATE 4 % EX LIQD: 60.0000 mL | Freq: Once | CUTANEOUS | Status: DC

## 2013-10-23 MED ORDER — OXYCODONE HCL 5 MG PO TABS: 5.0000 mg | ORAL_TABLET | Freq: Once | ORAL | Status: DC | PRN

## 2013-10-23 MED ORDER — PROPOFOL 10 MG/ML IV BOLUS
INTRAVENOUS | Status: DC | PRN
  Administered 2013-10-23: 250 mg via INTRAVENOUS

## 2013-10-23 MED ORDER — OXYCODONE HCL 5 MG/5ML PO SOLN: 5.0000 mg | Freq: Once | ORAL | Status: DC | PRN

## 2013-10-23 MED ORDER — FENTANYL CITRATE 0.05 MG/ML IJ SOLN
INTRAMUSCULAR | Status: AC
  Filled 2013-10-23: qty 5

## 2013-10-23 MED ORDER — HYDROMORPHONE HCL PF 1 MG/ML IJ SOLN
0.5000 mg | INTRAMUSCULAR | Status: DC | PRN
  Administered 2013-10-23 – 2013-10-24 (×6): 1 mg via INTRAVENOUS
  Filled 2013-10-23 (×6): qty 1

## 2013-10-23 MED ORDER — MIDAZOLAM HCL 2 MG/2ML IJ SOLN
INTRAMUSCULAR | Status: AC
  Filled 2013-10-23: qty 2

## 2013-10-23 MED ORDER — INFLUENZA VAC SPLIT QUAD 0.5 ML IM SUSP
0.5000 mL | INTRAMUSCULAR | Status: DC
  Filled 2013-10-23: qty 0.5

## 2013-10-23 MED ORDER — FENTANYL CITRATE 0.05 MG/ML IJ SOLN
INTRAMUSCULAR | Status: DC | PRN
  Administered 2013-10-23 (×2): 50 ug via INTRAVENOUS
  Administered 2013-10-23 (×4): 100 ug via INTRAVENOUS

## 2013-10-23 MED ORDER — KCL IN DEXTROSE-NACL 20-5-0.45 MEQ/L-%-% IV SOLN
INTRAVENOUS | Status: DC
  Administered 2013-10-23: 19:00:00 via INTRAVENOUS
  Filled 2013-10-23 (×3): qty 1000

## 2013-10-23 MED ORDER — KETOROLAC TROMETHAMINE 30 MG/ML IJ SOLN
INTRAMUSCULAR | Status: AC
  Filled 2013-10-23: qty 1

## 2013-10-23 MED ORDER — ONDANSETRON HCL 4 MG/2ML IJ SOLN: 4.0000 mg | Freq: Four times a day (QID) | INTRAMUSCULAR | Status: DC | PRN

## 2013-10-23 MED ORDER — ONDANSETRON HCL 4 MG PO TABS: 4.0000 mg | ORAL_TABLET | Freq: Four times a day (QID) | ORAL | Status: DC | PRN

## 2013-10-23 MED ORDER — OXYCODONE HCL 5 MG PO TABS
ORAL_TABLET | ORAL | Status: AC
  Administered 2013-10-23: 5 mg
  Filled 2013-10-23: qty 1

## 2013-10-23 MED ORDER — METHOCARBAMOL 500 MG PO TABS
ORAL_TABLET | ORAL | Status: AC
  Filled 2013-10-23: qty 1

## 2013-10-23 MED ORDER — LIDOCAINE HCL (CARDIAC) 20 MG/ML IV SOLN
INTRAVENOUS | Status: AC
  Filled 2013-10-23: qty 5

## 2013-10-23 MED ORDER — CEFAZOLIN SODIUM-DEXTROSE 2-3 GM-% IV SOLR
INTRAVENOUS | Status: AC
  Administered 2013-10-23: 2 g via INTRAVENOUS
  Filled 2013-10-23: qty 50

## 2013-10-23 MED ORDER — BISACODYL 10 MG RE SUPP: 10.0000 mg | Freq: Every day | RECTAL | Status: DC | PRN

## 2013-10-23 MED ORDER — SENNOSIDES-DOCUSATE SODIUM 8.6-50 MG PO TABS: 1.0000 | ORAL_TABLET | Freq: Every evening | ORAL | Status: DC | PRN

## 2013-10-23 MED ORDER — FLEET ENEMA 7-19 GM/118ML RE ENEM: 1.0000 | ENEMA | Freq: Once | RECTAL | Status: AC | PRN

## 2013-10-23 MED ORDER — ONDANSETRON HCL 4 MG/2ML IJ SOLN
4.0000 mg | Freq: Four times a day (QID) | INTRAMUSCULAR | Status: DC | PRN
  Administered 2013-10-24: 4 mg via INTRAVENOUS
  Filled 2013-10-23: qty 2

## 2013-10-23 MED ORDER — GLYCOPYRROLATE 0.2 MG/ML IJ SOLN
INTRAMUSCULAR | Status: DC | PRN
  Administered 2013-10-23: 0.3 mg via INTRAVENOUS

## 2013-10-23 MED ORDER — HYDROMORPHONE HCL PF 1 MG/ML IJ SOLN
INTRAMUSCULAR | Status: AC
  Filled 2013-10-23: qty 1

## 2013-10-23 MED ORDER — METHOCARBAMOL 500 MG PO TABS
500.0000 mg | ORAL_TABLET | Freq: Four times a day (QID) | ORAL | Status: DC | PRN
  Administered 2013-10-23: 500 mg via ORAL

## 2013-10-23 MED ORDER — VECURONIUM BROMIDE 10 MG IV SOLR
INTRAVENOUS | Status: DC | PRN
  Administered 2013-10-23: 2 mg via INTRAVENOUS

## 2013-10-23 MED ORDER — KETOROLAC TROMETHAMINE 30 MG/ML IJ SOLN
15.0000 mg | Freq: Once | INTRAMUSCULAR | Status: AC | PRN
  Administered 2013-10-23: 30 mg via INTRAVENOUS

## 2013-10-23 MED ORDER — LACTATED RINGERS IV SOLN
INTRAVENOUS | Status: DC
  Administered 2013-10-23: 50 mL/h via INTRAVENOUS

## 2013-10-23 MED ORDER — SODIUM CHLORIDE 0.9 % IV SOLN: INTRAVENOUS | Status: DC

## 2013-10-23 MED ORDER — METOCLOPRAMIDE HCL 5 MG/ML IJ SOLN
5.0000 mg | Freq: Three times a day (TID) | INTRAMUSCULAR | Status: DC | PRN
  Administered 2013-10-24: 10 mg via INTRAVENOUS
  Filled 2013-10-23: qty 2

## 2013-10-23 MED ORDER — DOCUSATE SODIUM 100 MG PO CAPS
100.0000 mg | ORAL_CAPSULE | Freq: Two times a day (BID) | ORAL | Status: DC
  Administered 2013-10-23: 100 mg via ORAL
  Administered 2013-10-24: 10:00:00 via ORAL
  Filled 2013-10-23 (×2): qty 1

## 2013-10-23 MED ORDER — NEOSTIGMINE METHYLSULFATE 1 MG/ML IJ SOLN
INTRAMUSCULAR | Status: AC
  Filled 2013-10-23: qty 10

## 2013-10-23 MED ORDER — 0.9 % SODIUM CHLORIDE (POUR BTL) OPTIME
TOPICAL | Status: DC | PRN
  Administered 2013-10-23 (×3): 1000 mL

## 2013-10-23 MED ORDER — METOCLOPRAMIDE HCL 10 MG PO TABS: 5.0000 mg | ORAL_TABLET | Freq: Three times a day (TID) | ORAL | Status: DC | PRN

## 2013-10-23 MED ORDER — ZOLPIDEM TARTRATE 5 MG PO TABS: 5.0000 mg | ORAL_TABLET | Freq: Every evening | ORAL | Status: DC | PRN

## 2013-10-23 MED ORDER — CEFAZOLIN SODIUM-DEXTROSE 2-3 GM-% IV SOLR: 2.0000 g | INTRAVENOUS | Status: DC

## 2013-10-23 MED ORDER — METHOCARBAMOL 100 MG/ML IJ SOLN
500.0000 mg | Freq: Four times a day (QID) | INTRAVENOUS | Status: DC | PRN
  Filled 2013-10-23: qty 5

## 2013-10-23 MED ORDER — ASPIRIN EC 325 MG PO TBEC
325.0000 mg | DELAYED_RELEASE_TABLET | Freq: Every day | ORAL | Status: DC
  Administered 2013-10-23 – 2013-10-24 (×2): 325 mg via ORAL
  Filled 2013-10-23 (×2): qty 1

## 2013-10-23 MED ORDER — PROPOFOL 10 MG/ML IV BOLUS
INTRAVENOUS | Status: AC
  Filled 2013-10-23: qty 20

## 2013-10-23 MED ORDER — NEOSTIGMINE METHYLSULFATE 1 MG/ML IJ SOLN
INTRAMUSCULAR | Status: DC | PRN
  Administered 2013-10-23: 2 mg via INTRAVENOUS

## 2013-10-23 MED ORDER — LIDOCAINE HCL (CARDIAC) 20 MG/ML IV SOLN
INTRAVENOUS | Status: DC | PRN
  Administered 2013-10-23: 100 mg via INTRAVENOUS

## 2013-10-23 MED ORDER — ONDANSETRON HCL 4 MG/2ML IJ SOLN
INTRAMUSCULAR | Status: DC | PRN
  Administered 2013-10-23: 4 mg via INTRAVENOUS

## 2013-10-23 MED ORDER — HYDROMORPHONE HCL PF 1 MG/ML IJ SOLN
0.2500 mg | INTRAMUSCULAR | Status: DC | PRN
  Administered 2013-10-23 (×5): 0.5 mg via INTRAVENOUS

## 2013-10-23 SURGICAL SUPPLY — 67 items
BANDAGE ELASTIC 4 VELCRO ST LF (GAUZE/BANDAGES/DRESSINGS) ×3 IMPLANT
BANDAGE ELASTIC 6 VELCRO ST LF (GAUZE/BANDAGES/DRESSINGS) ×3 IMPLANT
BANDAGE ESMARK 6X9 LF (GAUZE/BANDAGES/DRESSINGS) IMPLANT
BANDAGE GAUZE ELAST BULKY 4 IN (GAUZE/BANDAGES/DRESSINGS) ×3 IMPLANT
BIT DRILL CALIBRATED 4.2 (BIT) ×1 IMPLANT
BIT DRILL SHORT 4.2 (BIT) ×1 IMPLANT
BLADE SURG 15 STRL LF DISP TIS (BLADE) ×1 IMPLANT
BLADE SURG 15 STRL SS (BLADE) ×2
BLADE SURG ROTATE 9660 (MISCELLANEOUS) IMPLANT
BNDG COHESIVE 6X5 TAN STRL LF (GAUZE/BANDAGES/DRESSINGS) ×3 IMPLANT
BNDG ESMARK 6X9 LF (GAUZE/BANDAGES/DRESSINGS)
BONE CANC CHIPS 20CC PCAN1/4 (Bone Implant) ×3 IMPLANT
CHIPS CANC BONE 20CC PCAN1/4 (Bone Implant) ×1 IMPLANT
CLOTH BEACON ORANGE TIMEOUT ST (SAFETY) ×3 IMPLANT
COVER SURGICAL LIGHT HANDLE (MISCELLANEOUS) ×6 IMPLANT
CUFF TOURNIQUET SINGLE 34IN LL (TOURNIQUET CUFF) IMPLANT
CUFF TOURNIQUET SINGLE 44IN (TOURNIQUET CUFF) IMPLANT
DRAPE C-ARM 42X72 X-RAY (DRAPES) ×3 IMPLANT
DRAPE INCISE IOBAN 66X45 STRL (DRAPES) ×3 IMPLANT
DRAPE ORTHO SPLIT 77X108 STRL (DRAPES) ×4
DRAPE PROXIMA HALF (DRAPES) ×6 IMPLANT
DRAPE SURG ORHT 6 SPLT 77X108 (DRAPES) ×2 IMPLANT
DRAPE U-SHAPE 47X51 STRL (DRAPES) ×3 IMPLANT
DRILL BIT CALIBRATED 4.2 (BIT) ×3
DRILL BIT SHORT 4.2 (BIT) ×2
DURAPREP 26ML APPLICATOR (WOUND CARE) ×3 IMPLANT
ELECT REM PT RETURN 9FT ADLT (ELECTROSURGICAL) ×3
ELECTRODE REM PT RTRN 9FT ADLT (ELECTROSURGICAL) ×1 IMPLANT
FACESHIELD LNG OPTICON STERILE (SAFETY) IMPLANT
FIBERSTICK 2 (SUTURE) ×3 IMPLANT
GAUZE XEROFORM 5X9 LF (GAUZE/BANDAGES/DRESSINGS) ×3 IMPLANT
GEL DBM STIMUBLAST 5CC (Bone Implant) ×3 IMPLANT
GLOVE BIOGEL PI IND STRL 8 (GLOVE) ×1 IMPLANT
GLOVE BIOGEL PI INDICATOR 8 (GLOVE) ×2
GLOVE SURG ORTHO 8.0 STRL STRW (GLOVE) ×3 IMPLANT
GOWN PREVENTION PLUS LG XLONG (DISPOSABLE) IMPLANT
GOWN PREVENTION PLUS XLARGE (GOWN DISPOSABLE) ×3 IMPLANT
GOWN STRL NON-REIN LRG LVL3 (GOWN DISPOSABLE) ×6 IMPLANT
KIT BASIN OR (CUSTOM PROCEDURE TRAY) ×3 IMPLANT
KIT ROOM TURNOVER OR (KITS) ×3 IMPLANT
MANIFOLD NEPTUNE II (INSTRUMENTS) ×3 IMPLANT
NAIL EXTRACTOR DISP (INSTRUMENTS) ×3 IMPLANT
NAIL TIBIAL EX CANN PROX 375MM (Nail) ×3 IMPLANT
NS IRRIG 1000ML POUR BTL (IV SOLUTION) ×6 IMPLANT
PACK GENERAL/GYN (CUSTOM PROCEDURE TRAY) ×3 IMPLANT
PAD ABD 8X10 STRL (GAUZE/BANDAGES/DRESSINGS) ×6 IMPLANT
PAD ARMBOARD 7.5X6 YLW CONV (MISCELLANEOUS) ×6 IMPLANT
PAD CAST 4YDX4 CTTN HI CHSV (CAST SUPPLIES) ×1 IMPLANT
PADDING CAST COTTON 4X4 STRL (CAST SUPPLIES) ×2
PADDING CAST COTTON 6X4 STRL (CAST SUPPLIES) ×3 IMPLANT
REAMER ROD DEEP FLUTE 2.5X950 (INSTRUMENTS) ×6 IMPLANT
SCREW LOCK STAR 5X46 (Screw) ×6 IMPLANT
SCREW LOCK STAR 5X50 (Screw) ×3 IMPLANT
SPONGE GAUZE 4X4 12PLY (GAUZE/BANDAGES/DRESSINGS) ×3 IMPLANT
STAPLER VISISTAT 35W (STAPLE) ×3 IMPLANT
STOCKINETTE IMPERVIOUS LG (DRAPES) ×3 IMPLANT
SUT ETHILON 3 0 PS 1 (SUTURE) ×12 IMPLANT
SUT ETHILON 4 0 PS 2 18 (SUTURE) ×3 IMPLANT
SUT VIC AB 0 CT1 27 (SUTURE) ×2
SUT VIC AB 0 CT1 27XBRD ANBCTR (SUTURE) ×1 IMPLANT
SUT VIC AB 2-0 CT1 27 (SUTURE) ×8
SUT VIC AB 2-0 CT1 TAPERPNT 27 (SUTURE) ×4 IMPLANT
SUT VIC AB 3-0 FS2 27 (SUTURE) ×3 IMPLANT
TOWEL OR 17X24 6PK STRL BLUE (TOWEL DISPOSABLE) ×3 IMPLANT
TOWEL OR 17X26 10 PK STRL BLUE (TOWEL DISPOSABLE) ×3 IMPLANT
TRAY FOLEY CATH 16FRSI W/METER (SET/KITS/TRAYS/PACK) IMPLANT
WATER STERILE IRR 1000ML POUR (IV SOLUTION) ×3 IMPLANT

## 2013-10-23 NOTE — Anesthesia Preprocedure Evaluation (Addendum)
Anesthesia Evaluation  Patient identified by MRN, date of birth, ID band Patient awake    Reviewed: Allergy & Precautions, H&P , NPO status , Patient's Chart, lab work & pertinent test results  History of Anesthesia Complications Negative for: history of anesthetic complications  Airway Mallampati: II TM Distance: >3 FB Neck ROM: Full    Dental  (+) Teeth Intact and Dental Advisory Given   Pulmonary former smoker,  breath sounds clear to auscultation        Cardiovascular negative cardio ROS  Rhythm:Regular Rate:Normal     Neuro/Psych    GI/Hepatic negative GI ROS, (+)     substance abuse  alcohol use and marijuana use,   Endo/Other  negative endocrine ROS  Renal/GU negative Renal ROS     Musculoskeletal   Abdominal   Peds  Hematology negative hematology ROS (+)   Anesthesia Other Findings   Reproductive/Obstetrics negative OB ROS                        Anesthesia Physical Anesthesia Plan  ASA: II  Anesthesia Plan: General   Post-op Pain Management:    Induction: Intravenous  Airway Management Planned: Oral ETT  Additional Equipment:   Intra-op Plan:   Post-operative Plan: Extubation in OR  Informed Consent: I have reviewed the patients History and Physical, chart, labs and discussed the procedure including the risks, benefits and alternatives for the proposed anesthesia with the patient or authorized representative who has indicated his/her understanding and acceptance.   Dental advisory given  Plan Discussed with: CRNA, Anesthesiologist and Surgeon  Anesthesia Plan Comments:         Anesthesia Quick Evaluation

## 2013-10-23 NOTE — Transfer of Care (Signed)
Immediate Anesthesia Transfer of Care Note  Patient: Garner Nashrimill L Arya  Procedure(s) Performed: Procedure(s) with comments: INTRAMEDULLARY (IM) NAIL TIBIAL (Left) - LEFT TIBIA EXCHANGE NAILING, OPEN BONE GRAFTING.  Patient Location: PACU  Anesthesia Type:General  Level of Consciousness: sedated and patient cooperative  Airway & Oxygen Therapy: Patient Spontanous Breathing and Patient connected to face mask oxygen  Post-op Assessment: Report given to PACU RN and Post -op Vital signs reviewed and stable  Post vital signs: Reviewed and stable  Complications: No apparent anesthesia complications

## 2013-10-23 NOTE — Anesthesia Postprocedure Evaluation (Signed)
Anesthesia Post Note  Patient: Daniel Bender  Procedure(s) Performed: Procedure(s) (LRB): INTRAMEDULLARY (IM) NAIL TIBIAL (Left)  Anesthesia type: General  Patient location: PACU  Post pain: Pain level controlled and Adequate analgesia  Post assessment: Post-op Vital signs reviewed, Patient's Cardiovascular Status Stable, Respiratory Function Stable, Patent Airway and Pain level controlled  Last Vitals:  Filed Vitals:   10/23/13 1533  BP: 145/92  Pulse: 75  Temp:   Resp: 13    Post vital signs: Reviewed and stable  Level of consciousness: awake, alert  and oriented  Complications: No apparent anesthesia complications

## 2013-10-23 NOTE — Brief Op Note (Signed)
10/23/2013  5:24 PM  PATIENT:  Garner Nashrimill L Pollak  38 y.o. male  PRE-OPERATIVE DIAGNOSIS:  NONUNION LEFT DISTAL TIBIA FRACTURE  POST-OPERATIVE DIAGNOSIS:  NONUNION LEFT DISTAL TIBIA FRACTURE  PROCEDURE:  Procedure(s): INTRAMEDULLARY (IM) NAIL TIBIAL  SURGEON:  Surgeon(s): Cammy CopaGregory Scott Kimya Mccahill, MD  ASSISTANT: s vernion  ANESTHESIA:   general  EBL: 100 ml    Total I/O In: 2400 [I.V.:2400] Out: 30 [Blood:30]  BLOOD ADMINISTERED: none  DRAINS: none   LOCAL MEDICATIONS USED:  none  SPECIMEN:  cxs x 1 no acute inflammation  COUNTS:  YES  TOURNIQUET:   Total Tourniquet Time Documented: Thigh (Left) - 71 minutes Total: Thigh (Left) - 71 minutes   DICTATION: .Other Dictation: Dictation Number [id number]  PLAN OF CARE: Admit for overnight observation  PATIENT DISPOSITION:  PACU - hemodynamically stable

## 2013-10-23 NOTE — Anesthesia Procedure Notes (Signed)
Procedure Name: Intubation Date/Time: 10/23/2013 11:47 AM Performed by: Lovie CholOCK, Shreeya Recendiz K Pre-anesthesia Checklist: Patient identified, Emergency Drugs available, Suction available, Patient being monitored and Timeout performed Patient Re-evaluated:Patient Re-evaluated prior to inductionOxygen Delivery Method: Circle system utilized Preoxygenation: Pre-oxygenation with 100% oxygen Intubation Type: IV induction Ventilation: Mask ventilation without difficulty Laryngoscope Size: Miller and 2 Grade View: Grade II Tube type: Oral Tube size: 7.5 mm Number of attempts: 1 Airway Equipment and Method: Stylet Placement Confirmation: ETT inserted through vocal cords under direct vision,  positive ETCO2,  CO2 detector and breath sounds checked- equal and bilateral Secured at: 23 cm Tube secured with: Tape Dental Injury: Teeth and Oropharynx as per pre-operative assessment

## 2013-10-23 NOTE — Preoperative (Signed)
Beta Blockers   Reason not to administer Beta Blockers:Not Applicable 
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
## 2013-10-23 NOTE — Progress Notes (Signed)
Dr Chaney MallingHodierne notified that pt states meds have not helped his pain. New ord. For additional Dilaudid and IV Toradol. Will cont to monitor closely,

## 2013-10-23 NOTE — Brief Op Note (Cosign Needed)
10/23/2013  3:04 PM  PATIENT:  Daniel Bender  [age] y.o. male  PRE-OPERATIVE DIAGNOSIS:  NONUNION LEFT DISTAL TIBIA FRACTURE  POST-OPERATIVE DIAGNOSIS:  NONUNION LEFT DISTAL TIBIA FRACTURE  PROCEDURE:  Procedure(s) with comments: INTRAMEDULLARY (IM) NAIL TIBIAL (Left) - LEFT TIBIA EXCHANGE NAILING, OPEN BONE GRAFTING.  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   Delay start of Pharmacological VTE agent (>24hrs) due to surgical blood loss or risk of bleeding: no

## 2013-10-24 ENCOUNTER — Encounter (HOSPITAL_COMMUNITY): Payer: Self-pay | Admitting: General Practice

## 2013-10-24 MED ORDER — METHOCARBAMOL 500 MG PO TABS: 500.0000 mg | ORAL_TABLET | Freq: Four times a day (QID) | ORAL | Status: DC | PRN

## 2013-10-24 MED ORDER — ONDANSETRON HCL 4 MG PO TABS: 4.0000 mg | ORAL_TABLET | Freq: Four times a day (QID) | ORAL | Status: DC | PRN

## 2013-10-24 MED ORDER — MENTHOL 3 MG MT LOZG: 1.0000 | LOZENGE | OROMUCOSAL | Status: DC | PRN

## 2013-10-24 MED ORDER — ASPIRIN 325 MG PO TBEC: 325.0000 mg | DELAYED_RELEASE_TABLET | Freq: Every day | ORAL | Status: DC

## 2013-10-24 MED ORDER — OXYCODONE-ACETAMINOPHEN 10-325 MG PO TABS: 1.0000 | ORAL_TABLET | Freq: Four times a day (QID) | ORAL | Status: DC | PRN

## 2013-10-24 NOTE — Progress Notes (Signed)
Subjective: Pt stable - pain controlled   Objective: Vital signs in last 24 hours: Temp:  [97.8 F (36.6 C)-98.5 F (36.9 C)] 97.8 F (36.6 C) (02/04 0511) Pulse Rate:  [64-89] 73 (02/04 0511) Resp:  [8-20] 20 (02/04 0511) BP: (114-155)/(66-94) 123/67 mmHg (02/04 0511) SpO2:  [97 %-100 %] 97 % (02/04 0511) Weight:  [84.823 kg (187 lb)] 84.823 kg (187 lb) (02/03 0854)  Intake/Output from previous day: 02/03 0701 - 02/04 0700 In: 3720 [P.O.:300; I.V.:3370; IV Piggyback:50] Out: 580 [Urine:550; Blood:30] Intake/Output this shift: Total I/O In: -  Out: 700 [Urine:700]  Exam:  Neurovascular intact  Labs: No results found for this basename: HGB,  in the last 72 hours No results found for this basename: WBC, RBC, HCT, PLT,  in the last 72 hours  Recent Labs  10/23/13 0838  NA 143  K 5.1  CL 105  CO2 29  BUN 6  CREATININE 1.28  GLUCOSE 93  CALCIUM 9.2   No results found for this basename: LABPT, INR,  in the last 72 hours  Assessment/Plan: Plan dc today after PT   DEAN,GREGORY SCOTT 10/24/2013, 8:01 AM

## 2013-10-24 NOTE — Progress Notes (Signed)
Orthopedic Tech Progress Note Patient Details:  Daniel Bender 1976-03-30 [id number]  Ortho Devices Type of Ortho Device: Crutches Ortho Device/Splint Interventions: Application   Cammer, Mickie BailJennifer Carol 10/24/2013, 12:51 PM

## 2013-10-24 NOTE — Progress Notes (Signed)
Patient provided with discharge instructions and follow up information. He is going home at this time with wife. Educated on signs/symptoms of infection and use of bone stimulator at home. He is going in private vehicle.
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
## 2013-10-25 NOTE — Discharge Summary (Signed)
Physician Discharge Summary  Patient ID: Daniel Bender MRN: [id number] DOB/AGE: 1976-08-11 38 y.o.  Admit date: 10/23/2013 Discharge date: 10/24/2013 Admission Diagnoses:  Active Problems:   Closed fracture of tibia with nonunion   Discharge Diagnoses:  Same  Surgeries: Procedure(s): Exchange INTRAMEDULLARY (IM) NAIL TIBIAL on 10/23/2013 with bone grafting   Consultants:    Discharged Condition: Stable  Hospital Course: Daniel Bender is an 38 y.o. male who was admitted 10/23/2013 with a chief complaint of left ankle pain, and found to have a diagnosis of delayed union tibia fracture.  They were brought to the operating room on 10/23/2013 and underwent the above named procedures.Tolerated it well and was dced to home wbat with crutches    Antibiotics given:  Anti-infectives   Start     Dose/Rate Route Frequency Ordered Stop   10/23/13 1830  ceFAZolin (ANCEF) IVPB 2 g/50 mL premix     2 g 100 mL/hr over 30 Minutes Intravenous Every 6 hours 10/23/13 1818 10/24/13 0600   10/23/13 0827  ceFAZolin (ANCEF) IVPB 2 g/50 mL premix  Status:  Discontinued     2 g 100 mL/hr over 30 Minutes Intravenous On call to O.R. 10/23/13 0827 10/23/13 1809    .  Recent vital signs:  Filed Vitals:   10/24/13 0511  BP: 123/67  Pulse: 73  Temp: 97.8 F (36.6 C)  Resp: 20    Recent laboratory studies:  Results for orders placed during the hospital encounter of 10/23/13  TISSUE CULTURE      Result Value Range   Specimen Description TISSUE LEG RIGHT     Special Requests NON UNION SITE PT ON ANCEF     Gram Stain       Value: NO WBC SEEN     NO ORGANISMS SEEN     [facility]formed at [facility]   Culture       Value: NO GROWTH 2 DAYS     Performed at [facility]   Report Status PENDING    AFB CULTURE WITH SMEAR      Result Value Range   Specimen Description TISSUE LEFT TIBIA     Special Requests [id number]     ACID FAST SMEAR       Value: NO ACID FAST BACILLI SEEN     Performed  at [facility]   Culture       Value: CULTURE WILL BE EXAMINED FOR 6 WEEKS BEFORE ISSUING A FINAL REPORT     Performed at [facility]   Report Status PENDING    TISSUE CULTURE      Result Value Range   Specimen Description TISSUE LEFT TIBIA     Special Requests [id number]     Gram Stain       Value: NO WBC SEEN     NO ORGANISMS SEEN     Performed at [facility]   Culture       Value: NO GROWTH 2 DAYS     Performed at [facility]   Report Status PENDING    FUNGUS CULTURE W SMEAR      Result Value Range   Specimen Description TISSUE LEFT TIBIA     Special Requests [id number]     Fungal Smear       Value: NO YEAST OR FUNGAL ELEMENTS SEEN     Performed at [facility]   Culture       Value: CULTURE IN PROGRESS FOR FOUR WEEKS  Performed at [facility]   Report Status PENDING    BASIC METABOLIC PANEL      Result Value Range   Sodium 143  137 - 147 mEq/L   Potassium 5.1  3.7 - 5.3 mEq/L   Chloride 105  96 - 112 mEq/L   CO2 29  19 - 32 mEq/L   Glucose, Bld 93  70 - 99 mg/dL   BUN 6  6 - 23 mg/dL   Creatinine, Ser 1.61  0.50 - 1.35 mg/dL   Calcium 9.2  8.4 - 09.6 mg/dL   GFR calc non Af Amer 70 (*) >90 mL/min   GFR calc Af Amer 81 (*) >90 mL/min  HEPATIC FUNCTION PANEL      Result Value Range   Total Protein 6.6  6.0 - 8.3 g/dL   Albumin 3.4 (*) 3.5 - 5.2 g/dL   AST 17  0 - 37 U/L   ALT 9  0 - 53 U/L   Alkaline Phosphatase 112  39 - 117 U/L   Total Bilirubin 0.5  0.3 - 1.2 mg/dL   Bilirubin, Direct <0.4  0.0 - 0.3 mg/dL   Indirect Bilirubin NOT CALCULATED  0.3 - 0.9 mg/dL    Discharge Medications:     Medication List         aspirin 325 MG EC tablet  Take 1 tablet (325 mg total) by mouth daily.     methocarbamol 500 MG tablet  Commonly known as:  ROBAXIN  Take 1 tablet (500 mg total) by mouth every 6 (six) hours as needed for muscle spasms.     ondansetron 4 MG tablet  Commonly known as:  ZOFRAN  Take 1  tablet (4 mg total) by mouth every 6 (six) hours as needed for nausea.     oxyCODONE-acetaminophen 10-325 MG per tablet  Commonly known as:  PERCOCET  Take 1-2 tablets by mouth every 6 (six) hours as needed for pain.        Diagnostic Studies: Dg Ankle 2 Views Left  10/23/2013   CLINICAL DATA:  Nonunion fracture of the distal left tibia.  EXAM: LEFT ANKLE - 2 VIEW; DG C-ARM 61-120 MIN  COMPARISON:  CT scan dated 10/10/2013  FLUOROSCOPY TIME:  1 min 38 seconds  FINDINGS: AP and lateral C-arm images demonstrate the patient has undergone exchange of the tibial nail with bone grafting at the site of nonunion. The nail and fixation screws appear in good position.  IMPRESSION: Left tibia exchange nailing and bone grafting as described.   Electronically Signed   By: Geanie Cooley M.D.   On: 10/23/2013 14:39   Ct Ankle Left Wo Contrast  10/10/2013   CLINICAL DATA:  Comminuted distal tibial and fibular fractures, with 2 prior surgeries and intramedullary rod fixation of the tibia.  EXAM: CT OF THE LEFT ANKLE WITHOUT CONTRAST  TECHNIQUE: Multidetector CT imaging was performed according to the standard protocol. Multiplanar CT image reconstructions were also generated.  COMPARISON:  Multiple exams, including DG ANKLE*L*PORT dated 04/03/2013; DG FEMUR*R* dated 03/24/2013; DG ANKLE COMPLETE*L* dated 03/24/2013; CT ANKLE*L* W/O CM dated 03/24/2013  FINDINGS: The dominant oblique fracture planes in the distal tibia traversed by the intramedullary nail are still present, with some orally cortication along the fracture plane margins of indicating early signs of nonunion. There is well open bone tracking along the distal tibia, especially posteriorly, and even some superficial areas were the well than bone traverses the fracture sites. However, these traversing regions are  very thin, all only several mm if that, and the immature bone is not thought to be biomechanically sound/significant. The overall appearance is most compatible  with early nonunion.  There is a single distal interlocking screw. Lucency around the rod and a screw in the distal fragment measures 1 mm in thickness. Bony resorption likely accounts for the 2.1 cm vertically by 2.2 cm anterior-posterior by 1.6 cm transverse focal bony absence between the distal fragment in the anterior intermediary fragment.  Solid healing response in the distal fibular fracture. Plafond and talar dome intact.  Screw tract from prior fixation in the calcaneus.  IMPRESSION: 1. 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 There is some very faint will open bone bridging posteriorly but not enough to be by a mechanically significant at this time. Early cortication along most of the fracture margins. 2. There is 1 mm of lucency along the distal rod and interlocking screw. At 1 mm such lucencies are not typically thought to qualify as being specific for loosening or infection. 3. Solid healing response of the distal fibular fracture.   Electronically Signed   By: Herbie Baltimore M.D.   On: 10/10/2013 15:36   Dg C-arm 61-120 Min  10/23/2013   CLINICAL DATA:  Nonunion fracture of the distal left tibia.  EXAM: LEFT ANKLE - 2 VIEW; DG C-ARM 61-120 MIN  COMPARISON:  CT scan dated 10/10/2013  FLUOROSCOPY TIME:  1 min 38 seconds  FINDINGS: AP and lateral C-arm images demonstrate the patient has undergone exchange of the tibial nail with bone grafting at the site of nonunion. The nail and fixation screws appear in good position.  IMPRESSION: Left tibia exchange nailing and bone grafting as described.   Electronically Signed   By: Geanie Cooley M.D.   On: 10/23/2013 14:39    Disposition: 01-Home or Self Care      Discharge Orders   Future Orders Complete By Expires   Call MD / Call 911  As directed    Comments:     If you experience chest pain or shortness of breath, CALL 911 and be transported to the hospital emergency room.  If you develope a fever above 101 F, pus (white drainage)  or increased drainage or redness at the wound, or calf pain, call your surgeon's office.   Constipation Prevention  As directed    Comments:     Drink plenty of fluids.  Prune juice may be helpful.  You may use a stool softener, such as Colace (over the counter) 100 mg twice a day.  Use MiraLax (over the counter) for constipation as needed.   Diet - low sodium heart healthy  As directed    Discharge instructions  As directed    Comments:     Weight bearing as tolerated with crutches Use bone stimulator daily Change dressing tomorrow Keep incisions dry   Full weight bearing  As directed    Questions:     Laterality:     Extremity:     Increase activity slowly as tolerated  As directed          Signed: DEAN,GREGORY SCOTT 10/25/2013, 8:21 AM

## 2013-10-26 LAB — TISSUE CULTURE
Culture: NO GROWTH
Gram Stain: NONE SEEN
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
## 2013-10-27 LAB — TISSUE CULTURE
CULTURE: NO GROWTH
GRAM STAIN: NONE SEEN

## 2013-10-30 ENCOUNTER — Encounter (HOSPITAL_COMMUNITY): Payer: Self-pay | Admitting: Orthopedic Surgery

## 2013-11-20 LAB — FUNGUS CULTURE W SMEAR: Fungal Smear: NONE SEEN

## 2013-12-05 LAB — AFB CULTURE WITH SMEAR (NOT AT ARMC): Acid Fast Smear: NONE SEEN

## 2014-10-31 ENCOUNTER — Encounter (HOSPITAL_BASED_OUTPATIENT_CLINIC_OR_DEPARTMENT_OTHER): Payer: Self-pay | Admitting: *Deleted

## 2014-10-31 ENCOUNTER — Emergency Department (HOSPITAL_BASED_OUTPATIENT_CLINIC_OR_DEPARTMENT_OTHER): Payer: No Typology Code available for payment source

## 2014-10-31 ENCOUNTER — Emergency Department (HOSPITAL_BASED_OUTPATIENT_CLINIC_OR_DEPARTMENT_OTHER)
Admission: EM | Admit: 2014-10-31 | Discharge: 2014-10-31 | Disposition: A | Discharge status: Home / Self Care | Payer: No Typology Code available for payment source | Attending: Emergency Medicine | Admitting: Emergency Medicine

## 2014-10-31 DIAGNOSIS — M25572 Pain in left ankle and joints of left foot: Secondary | ICD-10-CM | POA: Insufficient documentation

## 2014-10-31 DIAGNOSIS — Z87828 Personal history of other (healed) physical injury and trauma: Secondary | ICD-10-CM | POA: Diagnosis not present

## 2014-10-31 DIAGNOSIS — R52 Pain, unspecified: Secondary | ICD-10-CM

## 2014-10-31 DIAGNOSIS — M940 Chondrocostal junction syndrome [Tietze]: Secondary | ICD-10-CM | POA: Diagnosis not present

## 2014-10-31 DIAGNOSIS — G8929 Other chronic pain: Secondary | ICD-10-CM | POA: Diagnosis not present

## 2014-10-31 DIAGNOSIS — Z720 Tobacco use: Secondary | ICD-10-CM | POA: Insufficient documentation

## 2014-10-31 DIAGNOSIS — Z7982 Long term (current) use of aspirin: Secondary | ICD-10-CM | POA: Insufficient documentation

## 2014-10-31 DIAGNOSIS — R079 Chest pain, unspecified: Secondary | ICD-10-CM | POA: Diagnosis present

## 2014-10-31 LAB — BASIC METABOLIC PANEL
ANION GAP: 4 — AB (ref 5–15)
BUN: 8 mg/dL (ref 6–23)
CALCIUM: 9 mg/dL (ref 8.4–10.5)
CO2: 28 mmol/L (ref 19–32)
CREATININE: 1.37 mg/dL — AB (ref 0.50–1.35)
Chloride: 104 mmol/L (ref 96–112)
GFR calc Af Amer: 74 mL/min — ABNORMAL LOW (ref >90)
GFR, EST NON AFRICAN AMERICAN: 64 mL/min — AB (ref >90)
Glucose, Bld: 117 mg/dL — ABNORMAL HIGH (ref 70–99)
Potassium: 3.4 mmol/L — ABNORMAL LOW (ref 3.5–5.1)
Sodium: 136 mmol/L (ref 135–145)

## 2014-10-31 LAB — CBC WITH DIFFERENTIAL/PLATELET
BASOS PCT: 0 % (ref 0–1)
Basophils Absolute: 0.1 10*3/uL (ref 0.0–0.1)
Eosinophils Absolute: 0 10*3/uL (ref 0.0–0.7)
Eosinophils Relative: 0 % (ref 0–5)
HCT: 44.9 % (ref 39.0–52.0)
HEMOGLOBIN: 15.5 g/dL (ref 13.0–17.0)
LYMPHS ABS: 2.3 10*3/uL (ref 0.7–4.0)
Lymphocytes Relative: 17 % (ref 12–46)
MCH: 31.6 pg (ref 26.0–34.0)
MCHC: 34.5 g/dL (ref 30.0–36.0)
MCV: 91.4 fL (ref 78.0–100.0)
MONOS PCT: 5 % (ref 3–12)
Monocytes Absolute: 0.6 10*3/uL (ref 0.1–1.0)
NEUTROS PCT: 78 % — AB (ref 43–77)
Neutro Abs: 10.3 10*3/uL — ABNORMAL HIGH (ref 1.7–7.7)
Platelets: 312 10*3/uL (ref 150–400)
RBC: 4.91 MIL/uL (ref 4.22–5.81)
RDW: 12.7 % (ref 11.5–15.5)
WBC: 13.3 10*3/uL — ABNORMAL HIGH (ref 4.0–10.5)

## 2014-10-31 LAB — TROPONIN I

## 2014-10-31 LAB — D-DIMER, QUANTITATIVE (NOT AT ARMC)

## 2014-10-31 MED ORDER — IBUPROFEN 800 MG PO TABS: 800.0000 mg | ORAL_TABLET | Freq: Three times a day (TID) | ORAL | Status: DC

## 2014-10-31 MED ORDER — KETOROLAC TROMETHAMINE 60 MG/2ML IM SOLN
60.0000 mg | Freq: Once | INTRAMUSCULAR | Status: AC
  Administered 2014-10-31: 60 mg via INTRAMUSCULAR
  Filled 2014-10-31: qty 2
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
## 2014-10-31 NOTE — ED Notes (Signed)
C/o cp off and on x 2-3 months w dizziness and sob at times

## 2016-12-27 ENCOUNTER — Encounter (HOSPITAL_BASED_OUTPATIENT_CLINIC_OR_DEPARTMENT_OTHER): Payer: Self-pay | Admitting: Emergency Medicine

## 2016-12-27 ENCOUNTER — Emergency Department (HOSPITAL_BASED_OUTPATIENT_CLINIC_OR_DEPARTMENT_OTHER)
Admission: EM | Admit: 2016-12-27 | Discharge: 2016-12-27 | Disposition: A | Discharge status: Home / Self Care | Payer: No Typology Code available for payment source | Attending: Emergency Medicine | Admitting: Emergency Medicine

## 2016-12-27 DIAGNOSIS — R51 Headache: Secondary | ICD-10-CM | POA: Insufficient documentation

## 2016-12-27 DIAGNOSIS — F17210 Nicotine dependence, cigarettes, uncomplicated: Secondary | ICD-10-CM | POA: Insufficient documentation

## 2016-12-27 DIAGNOSIS — R519 Headache, unspecified: Secondary | ICD-10-CM

## 2016-12-27 MED ORDER — KETOROLAC TROMETHAMINE 30 MG/ML IJ SOLN
30.0000 mg | Freq: Once | INTRAMUSCULAR | Status: AC
  Administered 2016-12-27: 30 mg via INTRAMUSCULAR
  Filled 2016-12-27: qty 1

## 2016-12-27 MED ORDER — DIPHENHYDRAMINE HCL 25 MG PO CAPS
25.0000 mg | ORAL_CAPSULE | Freq: Once | ORAL | Status: AC
  Administered 2016-12-27: 25 mg via ORAL
  Filled 2016-12-27: qty 1

## 2016-12-27 MED ORDER — METOCLOPRAMIDE HCL 10 MG PO TABS
10.0000 mg | ORAL_TABLET | Freq: Once | ORAL | Status: AC
  Administered 2016-12-27: 10 mg via ORAL
  Filled 2016-12-27: qty 1

## 2016-12-27 NOTE — ED Triage Notes (Signed)
Patient reports that he has had a Headache to the right side of his head. The patient reports that he is having Nausea and vomiting and he is unable to "eathnuthin" - Patient in no distress in triage. Smiling without grimacing to the lights or sounds
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
## 2016-12-27 NOTE — Discharge Instructions (Signed)
Please read attached information. If you experience any new or worsening signs or symptoms please return to the emergency room for evaluation. Please follow-up with your primary care provider or specialist as discussed.  °

## 2017-02-01 ENCOUNTER — Encounter: Payer: Self-pay | Admitting: Family Medicine

## 2017-02-01 ENCOUNTER — Other Ambulatory Visit: Payer: Self-pay | Admitting: Family Medicine

## 2017-02-01 ENCOUNTER — Ambulatory Visit: Payer: Self-pay | Attending: Family Medicine | Admitting: Family Medicine

## 2017-02-01 ENCOUNTER — Other Ambulatory Visit: Payer: Self-pay

## 2017-02-01 VITALS — BP 108/71 | HR 71 | Temp 97.9°F | Resp 18 | Ht 70.0 in | Wt 207.2 lb

## 2017-02-01 DIAGNOSIS — F17210 Nicotine dependence, cigarettes, uncomplicated: Secondary | ICD-10-CM | POA: Insufficient documentation

## 2017-02-01 DIAGNOSIS — H547 Unspecified visual loss: Secondary | ICD-10-CM | POA: Insufficient documentation

## 2017-02-01 DIAGNOSIS — IMO0002 Reserved for concepts with insufficient information to code with codable children: Secondary | ICD-10-CM

## 2017-02-01 DIAGNOSIS — Z87898 Personal history of other specified conditions: Secondary | ICD-10-CM

## 2017-02-01 DIAGNOSIS — G43909 Migraine, unspecified, not intractable, without status migrainosus: Secondary | ICD-10-CM | POA: Insufficient documentation

## 2017-02-01 DIAGNOSIS — Z8709 Personal history of other diseases of the respiratory system: Secondary | ICD-10-CM

## 2017-02-01 DIAGNOSIS — G43709 Chronic migraine without aura, not intractable, without status migrainosus: Secondary | ICD-10-CM

## 2017-02-01 DIAGNOSIS — H53149 Visual discomfort, unspecified: Secondary | ICD-10-CM | POA: Insufficient documentation

## 2017-02-01 DIAGNOSIS — R112 Nausea with vomiting, unspecified: Secondary | ICD-10-CM | POA: Insufficient documentation

## 2017-02-01 DIAGNOSIS — Z7982 Long term (current) use of aspirin: Secondary | ICD-10-CM | POA: Insufficient documentation

## 2017-02-01 DIAGNOSIS — F1410 Cocaine abuse, uncomplicated: Secondary | ICD-10-CM | POA: Insufficient documentation

## 2017-02-01 DIAGNOSIS — Z8669 Personal history of other diseases of the nervous system and sense organs: Secondary | ICD-10-CM

## 2017-02-01 DIAGNOSIS — Z79899 Other long term (current) drug therapy: Secondary | ICD-10-CM | POA: Insufficient documentation

## 2017-02-01 DIAGNOSIS — Z87820 Personal history of traumatic brain injury: Secondary | ICD-10-CM

## 2017-02-01 DIAGNOSIS — R0600 Dyspnea, unspecified: Secondary | ICD-10-CM | POA: Insufficient documentation

## 2017-02-01 MED ORDER — MECLIZINE HCL 25 MG PO TABS: 25.0000 mg | ORAL_TABLET | Freq: Three times a day (TID) | ORAL | 0 refills | Status: DC | PRN

## 2017-02-01 MED ORDER — IBUPROFEN 800 MG PO TABS: 800.0000 mg | ORAL_TABLET | Freq: Three times a day (TID) | ORAL | 1 refills | Status: DC | PRN

## 2017-02-01 MED FILL — IBUPROFEN 800 MG TABLET: 800 | 10 days supply | Qty: 30 | Fill #0

## 2017-02-01 NOTE — Progress Notes (Signed)
Patient is here for establish care  Patient complains about having headaches on the right side everyday for a month now  Patient is not taking any current medication at all  Patient has not eaten for today

## 2017-02-01 NOTE — Patient Instructions (Signed)
You will be called with your labs results. Apply for orange card to complete referral process.   Migraine Headache A migraine headache is an intense, throbbing pain on one side or both sides of the head. Migraines may also cause other symptoms, such as nausea, vomiting, and sensitivity to light and noise. What are the causes? Doing or taking certain things may also trigger migraines, such as:  Alcohol.  Smoking.  Medicines, such as:  Medicine used to treat chest pain (nitroglycerine).  Birth control pills.  Estrogen pills.  Certain blood pressure medicines.  Aged cheeses, chocolate, or caffeine.  Foods or drinks that contain nitrates, glutamate, aspartame, or tyramine.  Physical activity. Other things that may trigger a migraine include:  Menstruation.  Pregnancy.  Hunger.  Stress, lack of sleep, too much sleep, or fatigue.  Weather changes. What increases the risk? The following factors may make you more likely to experience migraine headaches:  Age. Risk increases with age.  Family history of migraine headaches.  Being Caucasian.  Depression and anxiety.  Obesity.  Being a woman.  Having a hole in the heart (patent foramen ovale) or other heart problems. What are the signs or symptoms? The main symptom of this condition is pulsating or throbbing pain. Pain may:  Happen in any area of the head, such as on one side or both sides.  Interfere with daily activities.  Get worse with physical activity.  Get worse with exposure to bright lights or loud noises. Other symptoms may include:  Nausea.  Vomiting.  Dizziness.  General sensitivity to bright lights, loud noises, or smells. Before you get a migraine, you may get warning signs that a migraine is developing (aura). An aura may include:  Seeing flashing lights or having blind spots.  Seeing bright spots, halos, or zigzag lines.  Having tunnel vision or blurred vision.  Having numbness or  a tingling feeling.  Having trouble talking.  Having muscle weakness. How is this diagnosed? A migraine headache can be diagnosed based on:  Your symptoms.  A physical exam.  Tests, such as CT scan or MRI of the head. These imaging tests can help rule out other causes of headaches.  Taking fluid from the spine (lumbar puncture) and analyzing it (cerebrospinal fluid analysis, or CSF analysis). How is this treated? A migraine headache is usually treated with medicines that:  Relieve pain.  Relieve nausea.  Prevent migraines from coming back. Treatment may also include:  Acupuncture.  Lifestyle changes like avoiding foods that trigger migraines. Follow these instructions at home: Medicines   Take over-the-counter and prescription medicines only as told by your health care provider.  Do not drive or use heavy machinery while taking prescription pain medicine.  To prevent or treat constipation while you are taking prescription pain medicine, your health care provider may recommend that you:  Drink enough fluid to keep your urine clear or pale yellow.  Take over-the-counter or prescription medicines.  Eat foods that are high in fiber, such as fresh fruits and vegetables, whole grains, and beans.  Limit foods that are high in fat and processed sugars, such as fried and sweet foods. Lifestyle   Avoid alcohol use.  Do not use any products that contain nicotine or tobacco, such as cigarettes and e-cigarettes. If you need help quitting, ask your health care provider.  Get at least 8 hours of sleep every night.  Limit your stress. General instructions    Keep a journal to find out what may trigger  your migraine headaches. For example, write down:  What you eat and drink.  How much sleep you get.  Any change to your diet or medicines.  If you have a migraine:  Avoid things that make your symptoms worse, such as bright lights.  It may help to lie down in a dark,  quiet room.  Do not drive or use heavy machinery.  Ask your health care provider what activities are safe for you while you are experiencing symptoms.  Keep all follow-up visits as told by your health care provider. This is important. Contact a health care provider if:  You develop symptoms that are different or more severe than your usual migraine symptoms. Get help right away if:  Your migraine becomes severe.  You have a fever.  You have a stiff neck.  You have vision loss.  Your muscles feel weak or like you cannot control them.  You start to lose your balance often.  You develop trouble walking.  You faint. This information is not intended to replace advice given to you by your health care provider. Make sure you discuss any questions you have with your health care provider. Document Released: 09/06/2005 Document Revised: 03/26/2016 Document Reviewed: 02/23/2016 Elsevier Interactive Patient Education  2017 [employer]

## 2017-02-01 NOTE — Progress Notes (Signed)
Subjective:  Patient ID: Daniel Bender, male    DOB: Dec 15, 1975  Age: 41 y.o. MRN: [id number]  CC: Establish Care   HPI Daniel Bender presents for  Headache: Patient presents with headache. Symptoms began about 15 years ago.Generally, the headaches last about 1 hour to 1 1/2 hours and occur frequently. The headache do not seem to be related to any time of the day. The headaches are usually pounding and throbbing and are right-sided unilateral, frontal in location.  The patient rates his most severe headaches a 10 on a scale from 1 to 10. Recently, the headaches are increasing in both severity and frequency. Reports worsening of symptoms 2 months ago. Daily activities are affected by the headaches. Precipitating factors include none which have been determined. The headaches are usually not preceded by an aura. Associated neurologic symptoms which are present include: vision problems and vomiting. He reports 5 episodes of vomiting with the last month. Last reported eye exam was 3 years ago.The patient denies muscle weakness, numbness of extremities, speech difficulties and vomiting in the early morning. Other associated symptoms include: photophobia and vomiting. He also reports difficulty keeping his balance for the last 2 years, and an episode of SOB several ago. Symptoms which are not present include: nothing pertinent. Home treatment has included acetaminophen and ibuprofen with poor improvement. Other history includes: History of concussions related to playing sports in the past, most recent was in 2013 while playing basketball. He also has history of current cocaine use. He reports the last time he used cocaine was last week. Family history includes no known family members with significant headaches.    Social History  Substance Use Topics  . Smoking status: Heavy Tobacco Smoker    Packs/day: 1.00    Years: 20.00    Types: Cigarettes    Last attempt to quit: 09/22/2013  . Smokeless  tobacco: Never Used  . Alcohol use Yes     Comment: occassional     Outpatient Medications Prior to Visit  Medication Sig Dispense Refill  . aspirin EC 325 MG EC tablet Take 1 tablet (325 mg total) by mouth daily. 30 tablet 0  . methocarbamol (ROBAXIN) 500 MG tablet Take 1 tablet (500 mg total) by mouth every 6 (six) hours as needed for muscle spasms. 30 tablet 0  . ondansetron (ZOFRAN) 4 MG tablet Take 1 tablet (4 mg total) by mouth every 6 (six) hours as needed for nausea. 20 tablet 0  . oxyCODONE-acetaminophen (PERCOCET) 10-325 MG per tablet Take 1-2 tablets by mouth every 6 (six) hours as needed for pain. 60 tablet 0  . ibuprofen (ADVIL,MOTRIN) 800 MG tablet Take 1 tablet (800 mg total) by mouth 3 (three) times daily. 21 tablet 0   No facility-administered medications prior to visit.     ROS Review of Systems  Constitutional: Negative.   Eyes: Positive for photophobia and visual disturbance.  Respiratory: Negative.   Cardiovascular: Negative.   Gastrointestinal: Positive for nausea and vomiting.  Neurological: Positive for headaches.    Objective:  BP 108/71 (BP Location: Left Arm, Patient Position: Sitting, Cuff Size: Normal)   Pulse 71   Temp 97.9 F (36.6 C) (Oral)   Resp 18   Ht 5\' 10"  (1.778 m)   Wt 207 lb 3.2 oz (94 kg)   SpO2 99%   BMI 29.73 kg/m   BP/Weight 02/01/2017 12/27/2016 10/31/2014  Systolic BP 108 135 137  Diastolic BP 71 87 74  Wt. (Lbs) 207.2 208  185  BMI 29.73 29.84 25.81  Some encounter information is confidential and restricted. Go to Review Flowsheets activity to see all data.     Physical Exam  Constitutional: He is oriented to person, place, and time. He appears well-developed and well-nourished.  HENT:  Head: Normocephalic and atraumatic.  Right Ear: External ear normal.  Left Ear: External ear normal.  Nose: Nose normal.  Mouth/Throat: Oropharynx is clear and moist.  Eyes: Conjunctivae and EOM are normal. Pupils are equal, round, and  reactive to light.  Neck: No JVD present.  Cardiovascular: Normal rate, regular rhythm, normal heart sounds and intact distal pulses.   Pulmonary/Chest: Effort normal and breath sounds normal.  Abdominal: Soft. Bowel sounds are normal.  Musculoskeletal: Normal range of motion.  Neurological: He is alert and oriented to person, place, and time. He has normal reflexes. No cranial nerve deficit. He displays a negative Romberg sign. Coordination normal.  Skin: Skin is warm and dry.  Psychiatric: He has a normal mood and affect. He expresses no homicidal and no suicidal ideation. He expresses no suicidal plans and no homicidal plans.  Nursing note and vitals reviewed.   Assessment & Plan:   Problem List Items Addressed This Visit    None    Visit Diagnoses    Chronic migraine    -  Primary   Relevant Medications   ibuprofen (ADVIL,MOTRIN) 800 MG tablet   Other Relevant Orders   CT HEAD WO CONTRAST (Completed)   Cocaine substance abuse       Relevant Orders   EKG 12-Lead   Personal history of multiple concussions       Relevant Orders   CT HEAD WO CONTRAST (Completed)   Decreased visual acuity       Relevant Orders   CT HEAD WO CONTRAST (Completed)   Ambulatory referral to Ophthalmology   History of visual impairment       Relevant Orders   Ambulatory referral to Ophthalmology   Non-intractable vomiting with nausea, unspecified vomiting type       Relevant Medications   meclizine (ANTIVERT) 25 MG tablet   History of dyspnea       Relevant Orders   CBC with Differential (Completed)   Basic Metabolic Panel (Completed)      Meds ordered this encounter  Medications  . ibuprofen (ADVIL,MOTRIN) 800 MG tablet    Sig: Take 1 tablet (800 mg total) by mouth every 8 (eight) hours as needed for headache (Take with food).    Dispense:  30 tablet    Refill:  1    Order Specific Question:   Supervising Provider    Answer:   Quentin AngstJEGEDE, OLUGBEMIGA E [id number]]  . meclizine (ANTIVERT) 25 MG  tablet    Sig: Take 1 tablet (25 mg total) by mouth 3 (three) times daily as needed for nausea.    Dispense:  30 tablet    Refill:  0    Order Specific Question:   Supervising Provider    Answer:   Quentin AngstJEGEDE, OLUGBEMIGA E [id number]    Follow-up: Return if symptoms worsen or fail to improve.   Lizbeth BarkMandesia R Hairston FNP

## 2017-02-02 LAB — CBC WITH DIFFERENTIAL/PLATELET
BASOS: 0 %
Basophils Absolute: 0 10*3/uL (ref 0.0–0.2)
EOS (ABSOLUTE): 0.3 10*3/uL (ref 0.0–0.4)
Eos: 3 %
HEMATOCRIT: 45.9 % (ref 37.5–51.0)
Hemoglobin: 15 g/dL (ref 13.0–17.7)
Immature Grans (Abs): 0 10*3/uL (ref 0.0–0.1)
Immature Granulocytes: 0 %
LYMPHS ABS: 3.2 10*3/uL — AB (ref 0.7–3.1)
Lymphs: 33 %
MCH: 31.3 pg (ref 26.6–33.0)
MCHC: 32.7 g/dL (ref 31.5–35.7)
MCV: 96 fL (ref 79–97)
MONOS ABS: 0.8 10*3/uL (ref 0.1–0.9)
Monocytes: 8 %
Neutrophils Absolute: 5.3 10*3/uL (ref 1.4–7.0)
Neutrophils: 56 %
PLATELETS: 297 10*3/uL (ref 150–379)
RBC: 4.8 x10E6/uL (ref 4.14–5.80)
RDW: 13.1 % (ref 12.3–15.4)
WBC: 9.7 10*3/uL (ref 3.4–10.8)

## 2017-02-02 LAB — BASIC METABOLIC PANEL
BUN/Creatinine Ratio: 7 — ABNORMAL LOW (ref 9–20)
BUN: 9 mg/dL (ref 6–24)
CHLORIDE: 102 mmol/L (ref 96–106)
CO2: 24 mmol/L (ref 18–29)
Calcium: 9.8 mg/dL (ref 8.7–10.2)
Creatinine, Ser: 1.38 mg/dL — ABNORMAL HIGH (ref 0.76–1.27)
GFR calc non Af Amer: 63 mL/min/{1.73_m2} (ref >59)
GFR, EST AFRICAN AMERICAN: 72 mL/min/{1.73_m2} (ref >59)
Glucose: 97 mg/dL (ref 65–99)
POTASSIUM: 4.7 mmol/L (ref 3.5–5.2)
SODIUM: 143 mmol/L (ref 134–144)

## 2017-02-07 ENCOUNTER — Ambulatory Visit (HOSPITAL_COMMUNITY)
Admission: RE | Admit: 2017-02-07 | Discharge: 2017-02-07 | Disposition: A | Discharge status: Home / Self Care | Payer: Self-pay | Source: Ambulatory Visit | Attending: Family Medicine | Admitting: Family Medicine

## 2017-02-07 DIAGNOSIS — H547 Unspecified visual loss: Secondary | ICD-10-CM | POA: Insufficient documentation

## 2017-02-07 DIAGNOSIS — G43709 Chronic migraine without aura, not intractable, without status migrainosus: Secondary | ICD-10-CM | POA: Insufficient documentation

## 2017-02-07 DIAGNOSIS — IMO0002 Reserved for concepts with insufficient information to code with codable children: Secondary | ICD-10-CM

## 2017-02-07 DIAGNOSIS — Z87820 Personal history of traumatic brain injury: Secondary | ICD-10-CM | POA: Insufficient documentation

## 2017-02-09 ENCOUNTER — Other Ambulatory Visit: Payer: Self-pay | Admitting: Family Medicine

## 2017-02-09 ENCOUNTER — Telehealth: Payer: Self-pay

## 2017-02-09 DIAGNOSIS — G43709 Chronic migraine without aura, not intractable, without status migrainosus: Secondary | ICD-10-CM

## 2017-02-09 DIAGNOSIS — J341 Cyst and mucocele of nose and nasal sinus: Secondary | ICD-10-CM

## 2017-02-09 DIAGNOSIS — IMO0002 Reserved for concepts with insufficient information to code with codable children: Secondary | ICD-10-CM

## 2017-02-09 NOTE — Telephone Encounter (Signed)
CMA call patient regarding lab results   Patient did not answer wife did patient is out of town CMA left a message stating to call me back when he gets back

## 2017-02-09 NOTE — Telephone Encounter (Signed)
-----   Message from Lizbeth BarkMandesia R Hairston, FNP sent at 02/09/2017 12:22 PM EDT ----- Head CT showed no signs of mass, brain swelling, decreased blood flow, or hemorrhage. No hardening of the brain vessels. Right sinus showed small mucous rentention cyst. This can cause symptoms of chronic headache and congestion. You will be referred to neurology for chronic migraine and ENT for mucous retention cyst.  Labs that evaluated your blood cells, fluid and electrolyte balance are normal. No signs of anemia, infection, or inflammation present. Kidney function normal. Creatinine level is elevated. Overtime increasing creatinine levels can indicate declining kidney function. If taking creatinine containing supplements limit or avoid use.

## 2017-02-11 ENCOUNTER — Encounter: Payer: Self-pay | Admitting: Neurology

## 2017-05-04 ENCOUNTER — Ambulatory Visit: Payer: Self-pay | Admitting: Neurology

## 2017-07-22 ENCOUNTER — Ambulatory Visit: Payer: Self-pay | Admitting: Neurology

## 2017-07-25 ENCOUNTER — Ambulatory Visit: Payer: Self-pay | Admitting: Neurology

## 2017-07-29 IMAGING — CT CT HEAD W/O CM
4 series · 15 of 47 positions shown, 17 images · non-contrast
Comparison: None.

CLINICAL DATA: Migraine, headache for 5 months

EXAM:
CT HEAD WITHOUT CONTRAST
TECHNIQUE: Contiguous axial images were obtained from the base of the skull
through the vertex without intravenous contrast.

[Series 3: head without · axial · non-contrast · 0.47mm/px · z∈[-90,+30]mm · 7 of 34 slices shown, 9 images]
[im 5/34  brain]
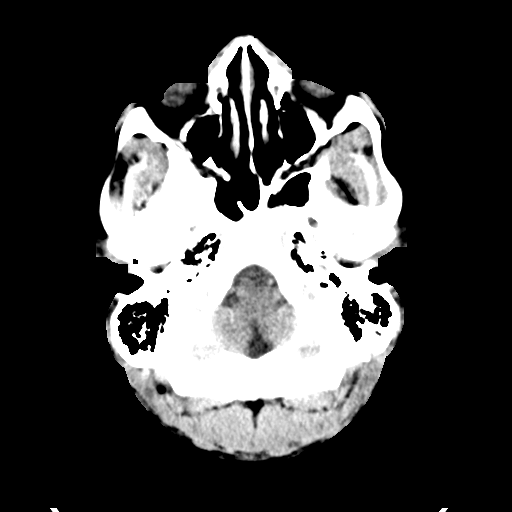
[im 5/34  bone]
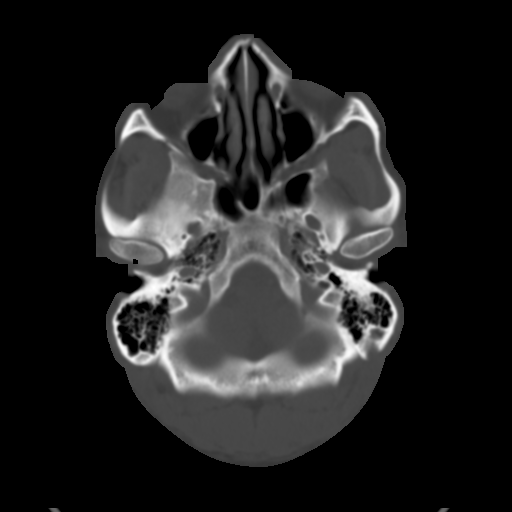
[im 9/34  brain]
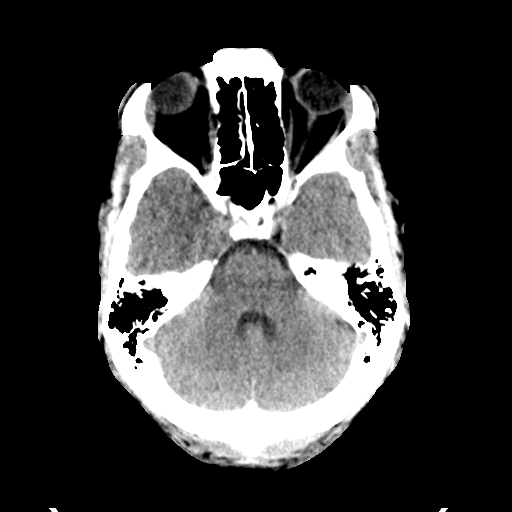
[im 13/34  brain]
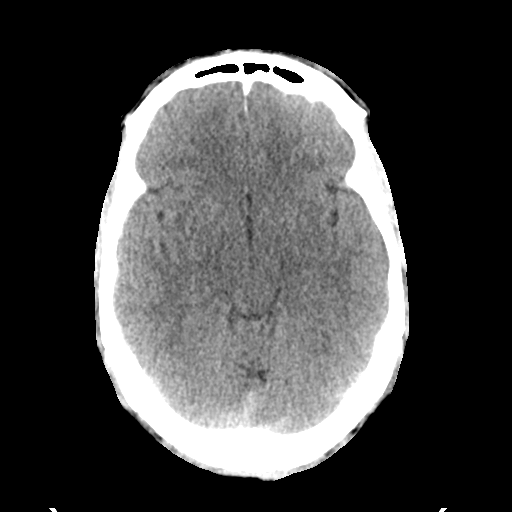
[im 17/34  brain]
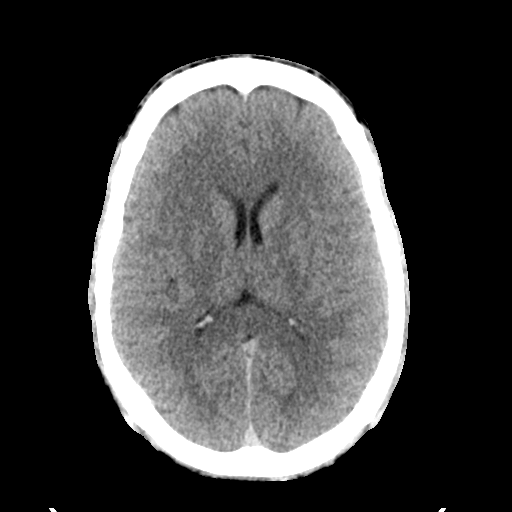
[im 21/34  brain]
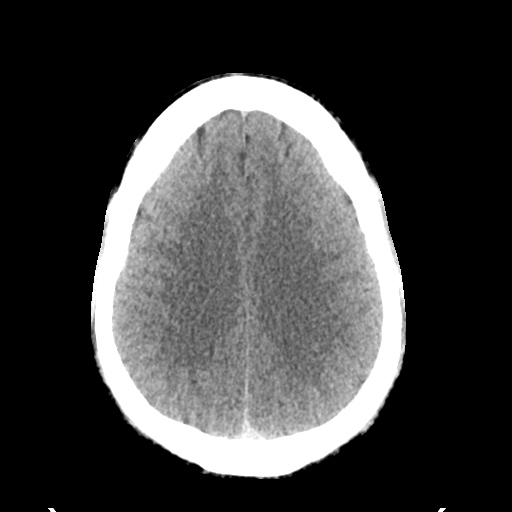
[im 21/34  bone]
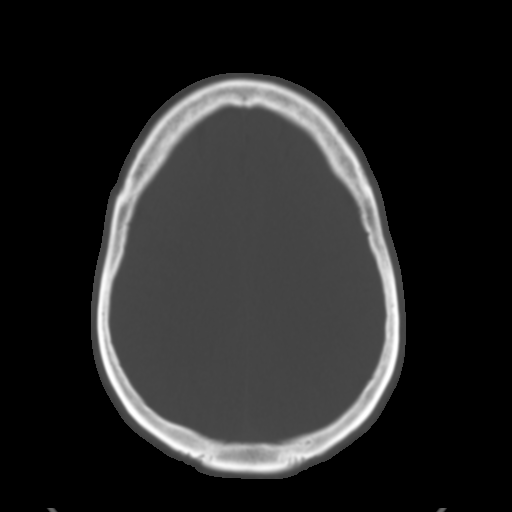
[im 25/34  brain]
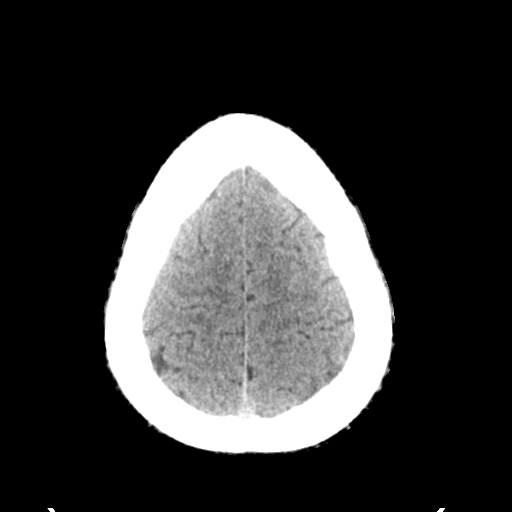
[im 29/34  brain]
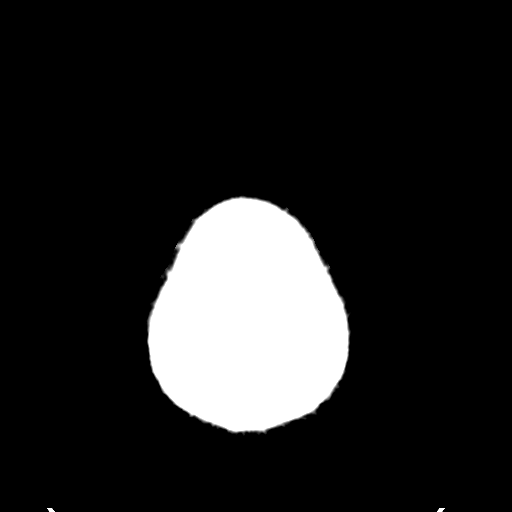

[Series 4: head bone · axial · 0.47mm/px · z∈[-94,-78]mm · 2 of 85 slices shown]
[im 9/85  bone]
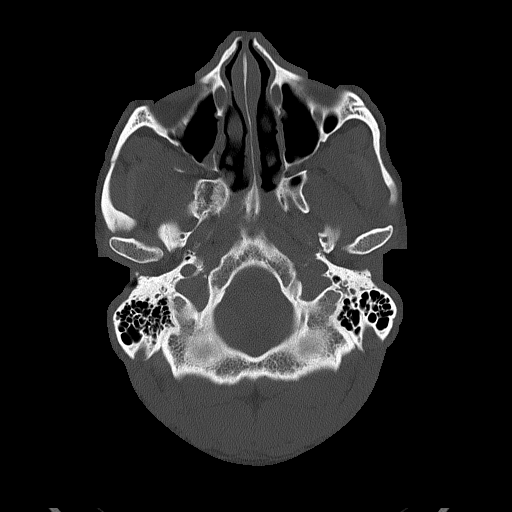
[im 17/85  bone]
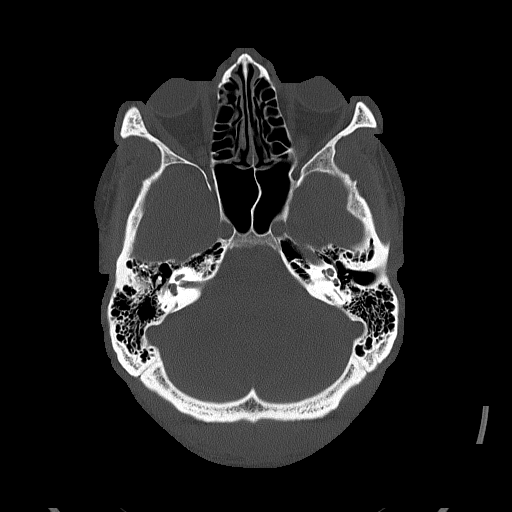

[Series 5: head without cor · coronal · non-contrast · 0.33mm/px · 3 of 79 slices shown]
[im 27/79  brain]
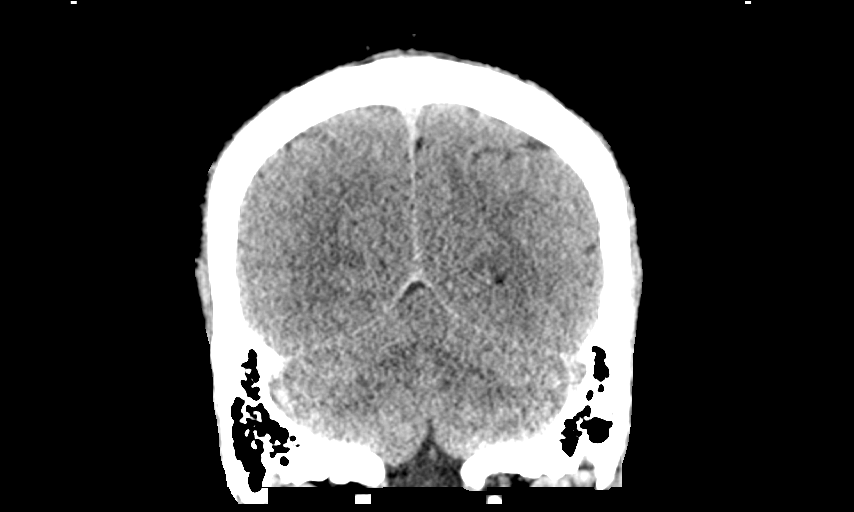
[im 35/79  brain]
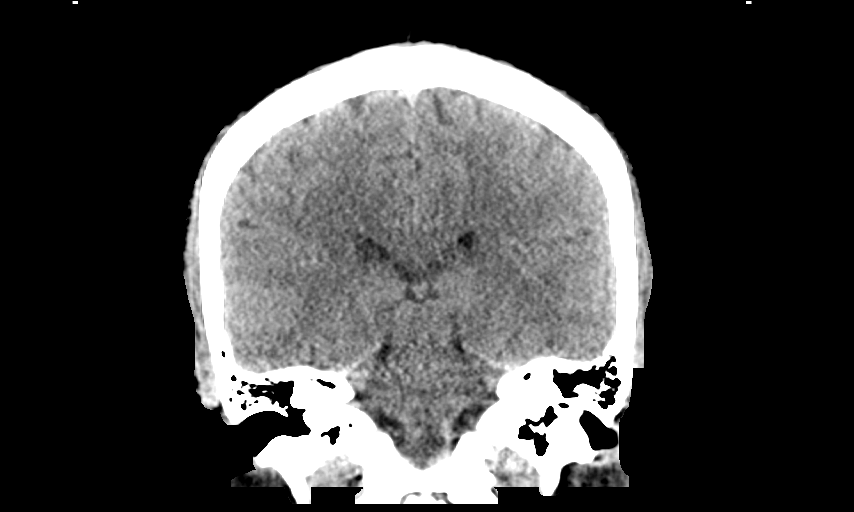
[im 44/79  brain]
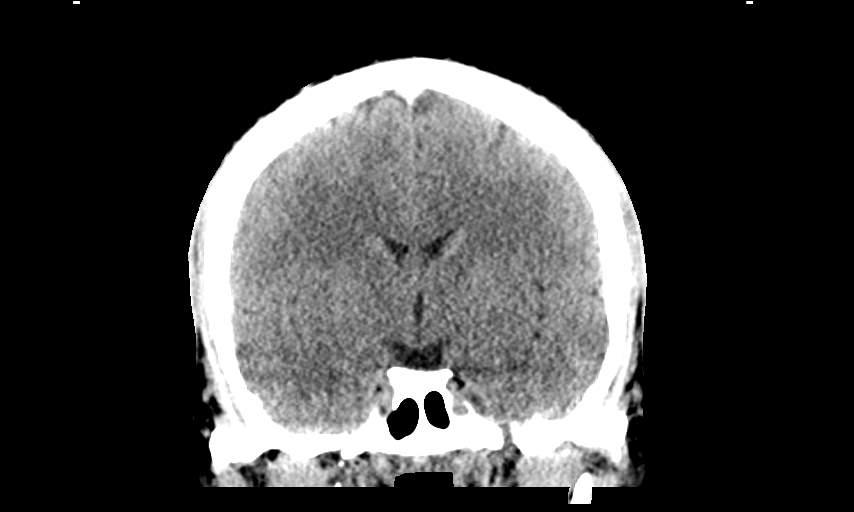

[Series 6: head without sag · sagittal · non-contrast · 0.41mm/px · 3 of 67 slices shown]
[im 23/67  brain]
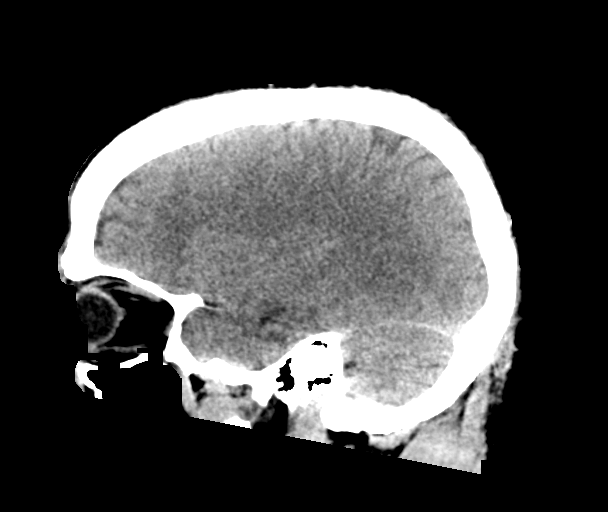
[im 34/67  brain]
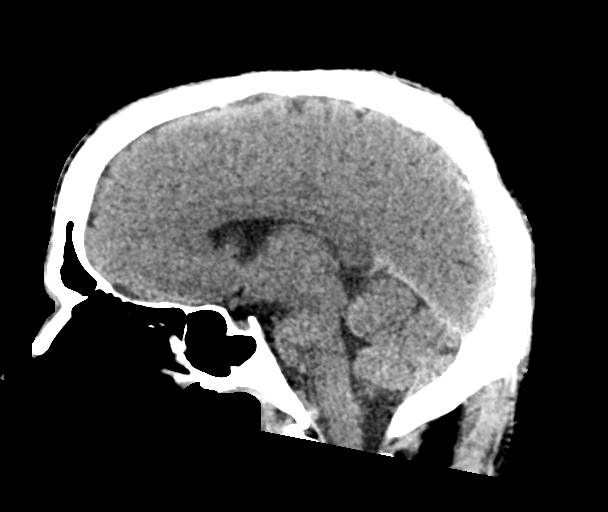
[im 45/67  brain]
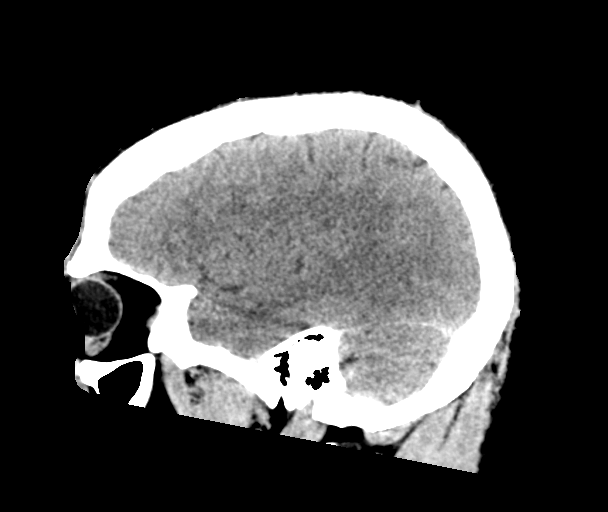

[15 of 47 positions shown; findings below may reference images not displayed]

FINDINGS: Brain: No evidence of acute infarction, hemorrhage, hydrocephalus,
extra-axial collection or mass lesion/mass effect.

Vascular: No hyperdense vessel or unexpected calcification.

Skull: No osseous abnormality.

Sinuses/Orbits: Small mucous retention cyst in the right maxillary
sinus. Visualized mastoid sinuses are clear. Visualized orbits
demonstrate no focal abnormality.

Other: Nondisplaced chronic right nasal bone fracture.
IMPRESSION: No acute intracranial pathology.

## 2018-03-31 ENCOUNTER — Emergency Department (HOSPITAL_COMMUNITY)
Admission: EM | Admit: 2018-03-31 | Discharge: 2018-04-01 | Disposition: A | Discharge status: Home / Self Care | Payer: Self-pay | Attending: Emergency Medicine | Admitting: Emergency Medicine

## 2018-03-31 ENCOUNTER — Encounter (HOSPITAL_COMMUNITY): Payer: Self-pay | Admitting: Emergency Medicine

## 2018-03-31 ENCOUNTER — Other Ambulatory Visit: Payer: Self-pay

## 2018-03-31 ENCOUNTER — Emergency Department (HOSPITAL_COMMUNITY): Payer: Self-pay

## 2018-03-31 DIAGNOSIS — K047 Periapical abscess without sinus: Secondary | ICD-10-CM | POA: Insufficient documentation

## 2018-03-31 DIAGNOSIS — F17210 Nicotine dependence, cigarettes, uncomplicated: Secondary | ICD-10-CM | POA: Insufficient documentation

## 2018-03-31 DIAGNOSIS — Z7982 Long term (current) use of aspirin: Secondary | ICD-10-CM | POA: Insufficient documentation

## 2018-03-31 LAB — BASIC METABOLIC PANEL
Anion gap: 8 (ref 5–15)
CHLORIDE: 109 mmol/L (ref 98–111)
CO2: 24 mmol/L (ref 22–32)
Calcium: 9.5 mg/dL (ref 8.9–10.3)
Creatinine, Ser: 1.34 mg/dL — ABNORMAL HIGH (ref 0.61–1.24)
GFR calc Af Amer: >60 mL/min (ref >60)
GFR calc non Af Amer: >60 mL/min (ref >60)
GLUCOSE: 87 mg/dL (ref 70–99)
Potassium: 4.8 mmol/L (ref 3.5–5.1)
Sodium: 141 mmol/L (ref 135–145)

## 2018-03-31 LAB — CBC WITH DIFFERENTIAL/PLATELET
ABS IMMATURE GRANULOCYTES: 0.1 10*3/uL (ref 0.0–0.1)
Basophils Absolute: 0.1 10*3/uL (ref 0.0–0.1)
Basophils Relative: 0 %
EOS PCT: 0 %
Eosinophils Absolute: 0 10*3/uL (ref 0.0–0.7)
HEMATOCRIT: 50.4 % (ref 39.0–52.0)
HEMOGLOBIN: 16.6 g/dL (ref 13.0–17.0)
Immature Granulocytes: 1 %
LYMPHS ABS: 2.8 10*3/uL (ref 0.7–4.0)
LYMPHS PCT: 15 %
MCH: 31.7 pg (ref 26.0–34.0)
MCHC: 32.9 g/dL (ref 30.0–36.0)
MCV: 96.4 fL (ref 78.0–100.0)
MONO ABS: 1.5 10*3/uL — AB (ref 0.1–1.0)
MONOS PCT: 8 %
Neutro Abs: 14.5 10*3/uL — ABNORMAL HIGH (ref 1.7–7.7)
Neutrophils Relative %: 76 %
Platelets: 257 10*3/uL (ref 150–400)
RBC: 5.23 MIL/uL (ref 4.22–5.81)
RDW: 13.1 % (ref 11.5–15.5)
WBC: 18.9 10*3/uL — ABNORMAL HIGH (ref 4.0–10.5)

## 2018-03-31 MED ORDER — IOHEXOL 300 MG/ML  SOLN
100.0000 mL | Freq: Once | INTRAMUSCULAR | Status: AC | PRN
  Administered 2018-03-31: 100 mL via INTRAVENOUS

## 2018-03-31 MED ORDER — MORPHINE SULFATE (PF) 4 MG/ML IV SOLN
4.0000 mg | Freq: Once | INTRAVENOUS | Status: AC
  Administered 2018-03-31: 4 mg via INTRAVENOUS
  Filled 2018-03-31: qty 1

## 2018-03-31 MED ORDER — SODIUM CHLORIDE 0.9 % IV SOLN
3.0000 g | Freq: Once | INTRAVENOUS | Status: AC
  Administered 2018-03-31: 3 g via INTRAVENOUS
  Filled 2018-03-31: qty 3
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
## 2018-03-31 NOTE — ED Triage Notes (Signed)
Pt reports R sided facial swelling upon waking this morning, difficulty speaking. Reports he thinks he has a cavity on the R side. Reports some nausea, no fevers.

## 2018-04-01 MED ORDER — HYDROCODONE-ACETAMINOPHEN 5-325 MG PO TABS: 1.0000 | ORAL_TABLET | Freq: Four times a day (QID) | ORAL | 0 refills | Status: DC | PRN

## 2018-04-01 MED ORDER — PENICILLIN V POTASSIUM 500 MG PO TABS: 500.0000 mg | ORAL_TABLET | Freq: Four times a day (QID) | ORAL | 0 refills | Status: DC

## 2018-04-01 NOTE — ED Provider Notes (Signed)
Patient signed out to me at shift change by resident Dr. Chanetta Marshallimberlake.  Patient is here for dental pain and facial swelling.  CT ordered.  Physical exams shows significant right-sided facial swelling, there is no palpable abscess, sublingual space is soft, no evidence of Ludwig's angina.  Normal phonation, no stridor.  CT shows periapical abscess and odontogenic cellulitis.  Discussed and reviewed the CT scan with Dr. Tami Ribasillow, recommends discharge with dental follow-up.  I have given the patient the name of our on-call dentist and have prescribed antibiotics and some pain medicine.  Clear and specific return precautions have been given.   Roxy HorsemanBrowning, Bernabe Dorce, PA-C 04/01/18 0107    Geoffery Lyonselo, [place], MD 04/01/18 [phone]

## 2018-04-01 NOTE — ED Notes (Signed)
Unable to get a signature due to no available WOW's.  Discharge instructions given.  Verbalized understanding.  Left at this time.

## 2019-03-06 ENCOUNTER — Emergency Department (HOSPITAL_BASED_OUTPATIENT_CLINIC_OR_DEPARTMENT_OTHER)
Admission: EM | Admit: 2019-03-06 | Discharge: 2019-03-06 | Disposition: A | Discharge status: Home / Self Care | Payer: PRIVATE HEALTH INSURANCE | Attending: Emergency Medicine | Admitting: Emergency Medicine

## 2019-03-06 ENCOUNTER — Encounter (HOSPITAL_BASED_OUTPATIENT_CLINIC_OR_DEPARTMENT_OTHER): Payer: Self-pay | Admitting: *Deleted

## 2019-03-06 ENCOUNTER — Other Ambulatory Visit: Payer: Self-pay

## 2019-03-06 ENCOUNTER — Emergency Department (HOSPITAL_BASED_OUTPATIENT_CLINIC_OR_DEPARTMENT_OTHER): Payer: PRIVATE HEALTH INSURANCE

## 2019-03-06 DIAGNOSIS — R569 Unspecified convulsions: Secondary | ICD-10-CM | POA: Insufficient documentation

## 2019-03-06 DIAGNOSIS — R51 Headache: Secondary | ICD-10-CM | POA: Insufficient documentation

## 2019-03-06 DIAGNOSIS — F17210 Nicotine dependence, cigarettes, uncomplicated: Secondary | ICD-10-CM | POA: Diagnosis not present

## 2019-03-06 DIAGNOSIS — F1210 Cannabis abuse, uncomplicated: Secondary | ICD-10-CM | POA: Insufficient documentation

## 2019-03-06 DIAGNOSIS — R519 Headache, unspecified: Secondary | ICD-10-CM

## 2019-03-06 LAB — CBC
HCT: 47.9 % (ref 39.0–52.0)
Hemoglobin: 15.8 g/dL (ref 13.0–17.0)
MCH: 32.1 pg (ref 26.0–34.0)
MCHC: 33 g/dL (ref 30.0–36.0)
MCV: 97.4 fL (ref 80.0–100.0)
Platelets: 256 10*3/uL (ref 150–400)
RBC: 4.92 MIL/uL (ref 4.22–5.81)
RDW: 12.5 % (ref 11.5–15.5)
WBC: 11.3 10*3/uL — ABNORMAL HIGH (ref 4.0–10.5)
nRBC: 0 % (ref 0.0–0.2)

## 2019-03-06 LAB — COMPREHENSIVE METABOLIC PANEL
ALT: 11 U/L (ref 0–44)
AST: 17 U/L (ref 15–41)
Albumin: 4.1 g/dL (ref 3.5–5.0)
Alkaline Phosphatase: 64 U/L (ref 38–126)
Anion gap: 9 (ref 5–15)
BUN: 9 mg/dL (ref 6–20)
CO2: 26 mmol/L (ref 22–32)
Calcium: 9.3 mg/dL (ref 8.9–10.3)
Chloride: 109 mmol/L (ref 98–111)
Creatinine, Ser: 1.37 mg/dL — ABNORMAL HIGH (ref 0.61–1.24)
GFR calc Af Amer: >60 mL/min (ref >60)
GFR calc non Af Amer: >60 mL/min (ref >60)
Glucose, Bld: 83 mg/dL (ref 70–99)
Potassium: 4.3 mmol/L (ref 3.5–5.1)
Sodium: 144 mmol/L (ref 135–145)
Total Bilirubin: 1 mg/dL (ref 0.3–1.2)
Total Protein: 6.8 g/dL (ref 6.5–8.1)

## 2019-03-06 MED ORDER — DIPHENHYDRAMINE HCL 50 MG/ML IJ SOLN
25.0000 mg | Freq: Once | INTRAMUSCULAR | Status: AC
  Administered 2019-03-06: 25 mg via INTRAVENOUS
  Filled 2019-03-06: qty 1

## 2019-03-06 MED ORDER — SODIUM CHLORIDE 0.9 % IV BOLUS
1000.0000 mL | Freq: Once | INTRAVENOUS | Status: AC
  Administered 2019-03-06: 1000 mL via INTRAVENOUS

## 2019-03-06 MED ORDER — IOHEXOL 350 MG/ML SOLN
100.0000 mL | Freq: Once | INTRAVENOUS | Status: AC | PRN
  Administered 2019-03-06: 15:00:00 75 mL via INTRAVENOUS

## 2019-03-06 MED ORDER — PROCHLORPERAZINE EDISYLATE 10 MG/2ML IJ SOLN
10.0000 mg | Freq: Once | INTRAMUSCULAR | Status: AC
  Administered 2019-03-06: 10 mg via INTRAVENOUS
  Filled 2019-03-06: qty 2

## 2019-03-06 NOTE — Discharge Instructions (Addendum)
You were evaluated in the Emergency Department and after careful evaluation, we did not find any emergent condition requiring admission or further testing in the hospital.  Please follow-up with the neurologists office provided.  Please do not drive or operate heavy machinery until he can be evaluated by the neurologist.  Please return to the Emergency Department if you experience any worsening of your condition.  We encourage you to follow up with a primary care provider.  Thank you for allowing [place] to be a part of your care.
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
## 2019-03-07 ENCOUNTER — Telehealth: Payer: Self-pay | Admitting: Diagnostic Neuroimaging

## 2019-03-07 NOTE — Telephone Encounter (Signed)
Pt gave consent for VV on the phone/ Pt understands that although there may be some limitations with this type of visit, we will take all precautions to reduce any security or privacy concerns.  Pt understands that this will be treated like an in office visit and we will file with pt's insurance, and there may be a patient responsible charge related to this service. Sent emai w link to [email or web address]

## 2019-03-07 NOTE — Addendum Note (Signed)
Addended by: Minna Antis on: 03/07/2019 03:23 PM   Modules accepted: Orders

## 2019-03-12 ENCOUNTER — Ambulatory Visit (INDEPENDENT_AMBULATORY_CARE_PROVIDER_SITE_OTHER): Payer: PRIVATE HEALTH INSURANCE | Admitting: Diagnostic Neuroimaging

## 2019-03-12 ENCOUNTER — Other Ambulatory Visit: Payer: Self-pay

## 2019-03-12 ENCOUNTER — Encounter: Payer: Self-pay | Admitting: Diagnostic Neuroimaging

## 2019-03-12 DIAGNOSIS — R569 Unspecified convulsions: Secondary | ICD-10-CM

## 2019-03-12 DIAGNOSIS — G43109 Migraine with aura, not intractable, without status migrainosus: Secondary | ICD-10-CM

## 2019-03-12 MED ORDER — TOPIRAMATE 50 MG PO TABS: 50.0000 mg | ORAL_TABLET | Freq: Two times a day (BID) | ORAL | 12 refills | Status: DC

## 2019-03-12 MED ORDER — RIZATRIPTAN BENZOATE 10 MG PO TABS: ORAL_TABLET | ORAL | 6 refills | Status: DC
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
## 2019-03-13 ENCOUNTER — Telehealth: Payer: Self-pay | Admitting: Diagnostic Neuroimaging

## 2019-03-13 NOTE — Telephone Encounter (Signed)
insurance pending faxed notes

## 2019-03-15 NOTE — Telephone Encounter (Signed)
ACS Auth: [id number] (exp. 03/13/19 to 05/13/19) order sent to GI. They will reach out to the patient to schedule.

## 2019-03-22 ENCOUNTER — Telehealth: Payer: Self-pay | Admitting: *Deleted

## 2019-03-22 NOTE — Telephone Encounter (Signed)
Called patient and scheduled 3 month follow up. Patient verbalized understanding, appreciation.

## 2019-04-14 ENCOUNTER — Other Ambulatory Visit: Payer: Self-pay

## 2019-04-14 ENCOUNTER — Ambulatory Visit
Admission: RE | Admit: 2019-04-14 | Discharge: 2019-04-14 | Disposition: A | Discharge status: Home / Self Care | Payer: PRIVATE HEALTH INSURANCE | Source: Ambulatory Visit | Attending: Diagnostic Neuroimaging | Admitting: Diagnostic Neuroimaging

## 2019-04-14 DIAGNOSIS — R569 Unspecified convulsions: Secondary | ICD-10-CM | POA: Diagnosis not present

## 2019-04-14 MED ORDER — GADOBENATE DIMEGLUMINE 529 MG/ML IV SOLN
17.0000 mL | Freq: Once | INTRAVENOUS | Status: AC | PRN
  Administered 2019-04-14: 15:00:00 17 mL via INTRAVENOUS

## 2019-04-23 ENCOUNTER — Telehealth: Payer: Self-pay | Admitting: *Deleted

## 2019-04-23 NOTE — Telephone Encounter (Signed)
LVM informing patient his MRI brain results are unremarkable. I left office number for him to schedule his EEG and for questions.

## 2019-05-15 ENCOUNTER — Emergency Department (HOSPITAL_COMMUNITY)
Admission: EM | Admit: 2019-05-15 | Discharge: 2019-05-15 | Disposition: A | Discharge status: Home / Self Care | Payer: Self-pay | Attending: Emergency Medicine | Admitting: Emergency Medicine

## 2019-05-15 ENCOUNTER — Other Ambulatory Visit: Payer: Self-pay

## 2019-05-15 ENCOUNTER — Emergency Department (HOSPITAL_COMMUNITY): Payer: Self-pay

## 2019-05-15 ENCOUNTER — Encounter (HOSPITAL_COMMUNITY): Payer: Self-pay | Admitting: Emergency Medicine

## 2019-05-15 DIAGNOSIS — Z7982 Long term (current) use of aspirin: Secondary | ICD-10-CM | POA: Insufficient documentation

## 2019-05-15 DIAGNOSIS — Z20828 Contact with and (suspected) exposure to other viral communicable diseases: Secondary | ICD-10-CM | POA: Insufficient documentation

## 2019-05-15 DIAGNOSIS — R059 Cough, unspecified: Secondary | ICD-10-CM

## 2019-05-15 DIAGNOSIS — R05 Cough: Secondary | ICD-10-CM | POA: Insufficient documentation

## 2019-05-15 DIAGNOSIS — F17210 Nicotine dependence, cigarettes, uncomplicated: Secondary | ICD-10-CM | POA: Insufficient documentation

## 2019-05-15 DIAGNOSIS — Z79899 Other long term (current) drug therapy: Secondary | ICD-10-CM | POA: Insufficient documentation

## 2019-05-15 DIAGNOSIS — R0789 Other chest pain: Secondary | ICD-10-CM | POA: Insufficient documentation

## 2019-05-15 LAB — CBC WITH DIFFERENTIAL/PLATELET
Abs Immature Granulocytes: 0.04 10*3/uL (ref 0.00–0.07)
Basophils Absolute: 0.1 10*3/uL (ref 0.0–0.1)
Basophils Relative: 1 %
Eosinophils Absolute: 0.1 10*3/uL (ref 0.0–0.5)
Eosinophils Relative: 1 %
HCT: 38.1 % — ABNORMAL LOW (ref 39.0–52.0)
Hemoglobin: 12.9 g/dL — ABNORMAL LOW (ref 13.0–17.0)
Immature Granulocytes: 0 %
Lymphocytes Relative: 22 %
Lymphs Abs: 2.8 10*3/uL (ref 0.7–4.0)
MCH: 32.7 pg (ref 26.0–34.0)
MCHC: 33.9 g/dL (ref 30.0–36.0)
MCV: 96.5 fL (ref 80.0–100.0)
Monocytes Absolute: 0.9 10*3/uL (ref 0.1–1.0)
Monocytes Relative: 7 %
Neutro Abs: 9 10*3/uL — ABNORMAL HIGH (ref 1.7–7.7)
Neutrophils Relative %: 69 %
Platelets: 203 10*3/uL (ref 150–400)
RBC: 3.95 MIL/uL — ABNORMAL LOW (ref 4.22–5.81)
RDW: 12.9 % (ref 11.5–15.5)
WBC: 12.9 10*3/uL — ABNORMAL HIGH (ref 4.0–10.5)
nRBC: 0 % (ref 0.0–0.2)

## 2019-05-15 LAB — COMPREHENSIVE METABOLIC PANEL
ALT: 15 U/L (ref 0–44)
AST: 19 U/L (ref 15–41)
Albumin: 3.3 g/dL — ABNORMAL LOW (ref 3.5–5.0)
Alkaline Phosphatase: 49 U/L (ref 38–126)
Anion gap: 8 (ref 5–15)
BUN: <5 mg/dL — ABNORMAL LOW (ref 6–20)
CO2: 26 mmol/L (ref 22–32)
Calcium: 9 mg/dL (ref 8.9–10.3)
Chloride: 109 mmol/L (ref 98–111)
Creatinine, Ser: 1.24 mg/dL (ref 0.61–1.24)
GFR calc Af Amer: >60 mL/min (ref >60)
GFR calc non Af Amer: >60 mL/min (ref >60)
Glucose, Bld: 97 mg/dL (ref 70–99)
Potassium: 4.1 mmol/L (ref 3.5–5.1)
Sodium: 143 mmol/L (ref 135–145)
Total Bilirubin: 1.2 mg/dL (ref 0.3–1.2)
Total Protein: 6.1 g/dL — ABNORMAL LOW (ref 6.5–8.1)

## 2019-05-15 LAB — TROPONIN I (HIGH SENSITIVITY): Troponin I (High Sensitivity): 4 ng/L (ref <18)

## 2019-05-15 MED ORDER — ACETAMINOPHEN 500 MG PO TABS
1000.0000 mg | ORAL_TABLET | Freq: Once | ORAL | Status: AC
  Administered 2019-05-15: 22:00:00 1000 mg via ORAL
  Filled 2019-05-15: qty 2

## 2019-05-15 NOTE — Discharge Instructions (Addendum)
You are being tested for coronavirus.  You can follow-up on MyChart for the results.  They should be back within 24 hours. Use Tylenol and ibuprofen as needed for chest wall pain. Make sure stay well-hydrated water. Return to the emergency room with any new, worsening, concerning symptoms.
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
## 2019-05-16 ENCOUNTER — Encounter: Payer: Self-pay | Admitting: Diagnostic Neuroimaging

## 2019-05-16 ENCOUNTER — Other Ambulatory Visit: Payer: PRIVATE HEALTH INSURANCE

## 2019-05-16 LAB — SARS CORONAVIRUS 2 (TAT 6-24 HRS): SARS Coronavirus 2: NEGATIVE

## 2019-05-22 DIAGNOSIS — R569 Unspecified convulsions: Secondary | ICD-10-CM

## 2019-05-22 HISTORY — DX: Unspecified convulsions: R56.9

## 2019-06-07 ENCOUNTER — Ambulatory Visit: Payer: PRIVATE HEALTH INSURANCE

## 2019-06-07 ENCOUNTER — Other Ambulatory Visit: Payer: Self-pay

## 2019-06-07 DIAGNOSIS — R569 Unspecified convulsions: Secondary | ICD-10-CM

## 2019-06-18 ENCOUNTER — Other Ambulatory Visit: Payer: Self-pay

## 2019-06-18 ENCOUNTER — Encounter: Payer: Self-pay | Admitting: Diagnostic Neuroimaging

## 2019-06-18 ENCOUNTER — Ambulatory Visit (INDEPENDENT_AMBULATORY_CARE_PROVIDER_SITE_OTHER): Payer: PRIVATE HEALTH INSURANCE | Admitting: Diagnostic Neuroimaging

## 2019-06-18 VITALS — BP 118/75 | HR 78 | Temp 96.8°F | Ht 70.0 in | Wt 179.8 lb

## 2019-06-18 DIAGNOSIS — G43109 Migraine with aura, not intractable, without status migrainosus: Secondary | ICD-10-CM

## 2019-06-18 DIAGNOSIS — R569 Unspecified convulsions: Secondary | ICD-10-CM | POA: Diagnosis not present

## 2019-06-18 MED ORDER — DIVALPROEX SODIUM 500 MG PO DR TAB: 500.0000 mg | DELAYED_RELEASE_TABLET | Freq: Two times a day (BID) | ORAL | 12 refills | Status: DC
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
## 2019-06-18 NOTE — Procedures (Signed)
   [facility]  EEG (ELECTROENCEPHALOGRAM) REPORT   STUDY DATE: 06/07/19 PATIENT NAME: Daniel Bender DOB: 02-Nov-1975 MRN: [id number]  ORDERING CLINICIAN: Andrey Spearman, MD   TECHNOLOGIST: Arelia Longest  TECHNIQUE: Electroencephalogram was recorded utilizing standard 10-20 system of lead placement and reformatted into average and bipolar montages.  RECORDING TIME: 22 minutes  ACTIVATION: hyperventilation and photic stimulation  CLINICAL INFORMATION: 43 year old male with seizure.  FINDINGS: Posterior dominant background rhythms, which attenuate with eye opening, ranging 8-9 hertz and 20-30 microvolts. No focal, lateralizing, epileptiform activity or seizures are seen. Patient recorded in the awake and drowsy state. EKG channel shows regular rhythm of 60-70 beats per minute.   IMPRESSION:   Normal EEG in the awake and drowsy states.     INTERPRETING PHYSICIAN:  Penni Bombard, MD Certified in Neurology, Neurophysiology and Neuroimaging  [facility] [address] [place], [place] [address] [phone]

## 2019-06-18 NOTE — Patient Instructions (Signed)
SEIZURE DISORDER (June 2020, Sept 2020) - start depakote 500mg  twice a day (may help with both seizure d/o and migraines)  - According to [facility] law, you can not drive unless you are seizure / syncope free for at least 6 months and under physician's care.   - Please maintain precautions. Do not participate in activities where a loss of awareness could harm you or someone else. No swimming alone, no tub bathing, no hot tubs, no driving, no operating motorized vehicles (cars, ATVs, motocycles, etc), lawnmowers, power tools or firearms. No standing at heights, such as rooftops, ladders or stairs. Avoid hot objects such as stoves, heaters, open fires. Wear a helmet when riding a bicycle, scooter, skateboard, etc. and avoid areas of traffic. Set your water heater to 120 degrees or less.   HEADACHES (MIGRAINE WITH AURA) - MIGRAINE PREVENTION --> depakote - MIGRAINE RESCUE --> rizatriptan 10mg  as needed for breakthrough headache; may repeat x 1 after 2 hours; max 2 tabs per day or 8 per month

## 2019-10-30 ENCOUNTER — Telehealth: Payer: Self-pay | Admitting: *Deleted

## 2019-10-30 ENCOUNTER — Encounter: Payer: Self-pay | Admitting: Diagnostic Neuroimaging

## 2019-10-30 ENCOUNTER — Ambulatory Visit: Payer: PRIVATE HEALTH INSURANCE | Admitting: Diagnostic Neuroimaging

## 2019-10-30 NOTE — Telephone Encounter (Signed)
Patient was no show for follow up today. 

## 2019-11-12 DIAGNOSIS — Z0289 Encounter for other administrative examinations: Secondary | ICD-10-CM

## 2019-11-20 ENCOUNTER — Telehealth: Payer: Self-pay | Admitting: *Deleted

## 2019-11-20 ENCOUNTER — Ambulatory Visit: Payer: PRIVATE HEALTH INSURANCE | Admitting: Diagnostic Neuroimaging

## 2019-11-20 NOTE — Telephone Encounter (Signed)
Patient arrived 7 minutes past his scheduled appointment time of 8:30 am. His FU was rescheduled.

## 2019-12-17 ENCOUNTER — Other Ambulatory Visit: Payer: Self-pay

## 2019-12-17 ENCOUNTER — Encounter: Payer: Self-pay | Admitting: Diagnostic Neuroimaging

## 2019-12-17 ENCOUNTER — Ambulatory Visit (INDEPENDENT_AMBULATORY_CARE_PROVIDER_SITE_OTHER): Payer: PRIVATE HEALTH INSURANCE | Admitting: Diagnostic Neuroimaging

## 2019-12-17 VITALS — BP 113/73 | HR 81 | Temp 97.9°F | Ht 70.0 in | Wt 178.0 lb

## 2019-12-17 DIAGNOSIS — G40909 Epilepsy, unspecified, not intractable, without status epilepticus: Secondary | ICD-10-CM

## 2019-12-17 MED ORDER — DIVALPROEX SODIUM 500 MG PO DR TAB: 500.0000 mg | DELAYED_RELEASE_TABLET | Freq: Two times a day (BID) | ORAL | 12 refills | Status: AC

## 2019-12-17 NOTE — Patient Instructions (Signed)
  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 Do not participate in activities where a loss of awareness could harm you or someone else. No swimming alone, no tub bathing, no hot tubs, no driving, no operating motorized vehicles (cars, ATVs, motocycles, etc), lawnmowers, power tools or firearms. No standing at heights, such as rooftops, ladders or stairs. Avoid hot objects such as stoves, heaters, open fires. Wear a helmet when riding a bicycle, scooter, skateboard, etc. and avoid areas of traffic. Set your water heater to 120 degrees or less.   HEADACHES (MIGRAINE WITH AURA) - MIGRAINE PREVENTION --> resolved with improved nutrition, exercise, sleep
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
## 2019-12-18 LAB — COMPREHENSIVE METABOLIC PANEL
ALT: 10 IU/L (ref 0–44)
AST: 16 IU/L (ref 0–40)
Albumin/Globulin Ratio: 2 (ref 1.2–2.2)
Albumin: 4 g/dL (ref 4.0–5.0)
Alkaline Phosphatase: 79 IU/L (ref 39–117)
BUN/Creatinine Ratio: 5 — ABNORMAL LOW (ref 9–20)
BUN: 7 mg/dL (ref 6–24)
Bilirubin Total: 0.3 mg/dL (ref 0.0–1.2)
CO2: 26 mmol/L (ref 20–29)
Calcium: 9.5 mg/dL (ref 8.7–10.2)
Chloride: 110 mmol/L — ABNORMAL HIGH (ref 96–106)
Creatinine, Ser: 1.36 mg/dL — ABNORMAL HIGH (ref 0.76–1.27)
GFR calc Af Amer: 72 mL/min/{1.73_m2} (ref >59)
GFR calc non Af Amer: 62 mL/min/{1.73_m2} (ref >59)
Globulin, Total: 2 g/dL (ref 1.5–4.5)
Glucose: 73 mg/dL (ref 65–99)
Potassium: 5.2 mmol/L (ref 3.5–5.2)
Sodium: 145 mmol/L — ABNORMAL HIGH (ref 134–144)
Total Protein: 6 g/dL (ref 6.0–8.5)

## 2019-12-18 LAB — CBC WITH DIFFERENTIAL/PLATELET
Basophils Absolute: 0.1 10*3/uL (ref 0.0–0.2)
Basos: 1 %
EOS (ABSOLUTE): 0.3 10*3/uL (ref 0.0–0.4)
Eos: 3 %
Hematocrit: 46.2 % (ref 37.5–51.0)
Hemoglobin: 15.8 g/dL (ref 13.0–17.7)
Immature Grans (Abs): 0 10*3/uL (ref 0.0–0.1)
Immature Granulocytes: 0 %
Lymphocytes Absolute: 2.9 10*3/uL (ref 0.7–3.1)
Lymphs: 22 %
MCH: 33.5 pg — ABNORMAL HIGH (ref 26.6–33.0)
MCHC: 34.2 g/dL (ref 31.5–35.7)
MCV: 98 fL — ABNORMAL HIGH (ref 79–97)
Monocytes Absolute: 0.9 10*3/uL (ref 0.1–0.9)
Monocytes: 7 %
Neutrophils Absolute: 8.8 10*3/uL — ABNORMAL HIGH (ref 1.4–7.0)
Neutrophils: 67 %
Platelets: 254 10*3/uL (ref 150–450)
RBC: 4.72 x10E6/uL (ref 4.14–5.80)
RDW: 11.9 % (ref 11.6–15.4)
WBC: 13.1 10*3/uL — ABNORMAL HIGH (ref 3.4–10.8)

## 2019-12-26 ENCOUNTER — Telehealth: Payer: Self-pay | Admitting: *Deleted

## 2020-01-01 NOTE — Telephone Encounter (Signed)
Spoke with patient and informed him that his lab showed persistent high WBC; Dr Marjory Lies recommends he follow up with PCP to monitor this. Patient had no questions,  verbalized understanding, appreciation.

## 2020-02-27 ENCOUNTER — Telehealth: Payer: Self-pay | Admitting: Diagnostic Neuroimaging

## 2020-02-27 NOTE — Telephone Encounter (Signed)
[name],Daniel Bender(wife not on DPR) has called stating at one point Dr Marjory Lies was going to have pt on 2 medications but one was about $200.00.  Wife stated that pt is in custody(jail) at [facility]. Wife states they are willing to cover the cost of both medications for pt so wife would like to know if both the divalproex (DEPAKOTE) 500 MG DR tablet and the other medication (wife does not recall the name) can be called [facility] [phone]  For the  [facility] jail.  The nurse at the jail can be reached at [phone].  Wife was reminded that she is not on DPR but that the message would be sent.

## 2020-02-27 NOTE — Telephone Encounter (Signed)
Continue depakote only. -VRP

## 2020-02-27 NOTE — Telephone Encounter (Addendum)
Unable to reach a person at [facility] jail with # wife provided with 2 attempts. Called [facility], spoke with Morrie Sheldon who stated they have multiple Rx on file for Depakote that the patient has never filled, including one from March 2021. She stated they can fill Rx if needed.

## 2020-07-01 ENCOUNTER — Ambulatory Visit: Payer: PRIVATE HEALTH INSURANCE | Admitting: Diagnostic Neuroimaging
